# Patient Record
Sex: Female | Born: 1960 | Race: White | Hispanic: No | Marital: Married | Smoking: Never smoker
Health system: Southern US, Community
[De-identification: ages and names within clinical notes are randomized; demographics above are authoritative.]

## PROBLEM LIST (undated history)

## (undated) DIAGNOSIS — I2510 Atherosclerotic heart disease of native coronary artery without angina pectoris: Principal | ICD-10-CM

## (undated) DIAGNOSIS — F4010 Social phobia, unspecified: Secondary | ICD-10-CM

## (undated) DIAGNOSIS — K5790 Diverticulosis of intestine, part unspecified, without perforation or abscess without bleeding: Secondary | ICD-10-CM

## (undated) DIAGNOSIS — F988 Other specified behavioral and emotional disorders with onset usually occurring in childhood and adolescence: Secondary | ICD-10-CM

## (undated) DIAGNOSIS — I471 Supraventricular tachycardia, unspecified: Secondary | ICD-10-CM

## (undated) DIAGNOSIS — G4730 Sleep apnea, unspecified: Secondary | ICD-10-CM

## (undated) DIAGNOSIS — M5136 Other intervertebral disc degeneration, lumbar region: Secondary | ICD-10-CM

## (undated) DIAGNOSIS — T884XXA Failed or difficult intubation, initial encounter: Secondary | ICD-10-CM

## (undated) DIAGNOSIS — J189 Pneumonia, unspecified organism: Secondary | ICD-10-CM

## (undated) DIAGNOSIS — M51369 Other intervertebral disc degeneration, lumbar region without mention of lumbar back pain or lower extremity pain: Secondary | ICD-10-CM

## (undated) DIAGNOSIS — K219 Gastro-esophageal reflux disease without esophagitis: Secondary | ICD-10-CM

## (undated) DIAGNOSIS — I499 Cardiac arrhythmia, unspecified: Secondary | ICD-10-CM

## (undated) DIAGNOSIS — E785 Hyperlipidemia, unspecified: Secondary | ICD-10-CM

## (undated) DIAGNOSIS — K648 Other hemorrhoids: Secondary | ICD-10-CM

## (undated) DIAGNOSIS — I4719 Other supraventricular tachycardia: Secondary | ICD-10-CM

## (undated) HISTORY — DX: Other intervertebral disc degeneration, lumbar region without mention of lumbar back pain or lower extremity pain: M51.369

## (undated) HISTORY — DX: Supraventricular tachycardia: I47.1

## (undated) HISTORY — DX: Diverticulosis of intestine, part unspecified, without perforation or abscess without bleeding: K57.90

## (undated) HISTORY — DX: Other intervertebral disc degeneration, lumbar region: M51.36

## (undated) HISTORY — PX: ABDOMINAL HYSTERECTOMY: SHX81

## (undated) HISTORY — DX: Other hemorrhoids: K64.8

## (undated) HISTORY — PX: TUBAL LIGATION: SHX77

## (undated) HISTORY — DX: Social phobia, unspecified: F40.10

## (undated) HISTORY — DX: Other supraventricular tachycardia: I47.19

## (undated) HISTORY — DX: Atherosclerotic heart disease of native coronary artery without angina pectoris: I25.10

## (undated) HISTORY — PX: OTHER SURGICAL HISTORY: SHX169

## (undated) HISTORY — DX: Supraventricular tachycardia, unspecified: I47.10

## (undated) HISTORY — DX: Hyperlipidemia, unspecified: E78.5

## (undated) HISTORY — PX: SHOULDER SURGERY: SHX246

## (undated) HISTORY — DX: Other specified behavioral and emotional disorders with onset usually occurring in childhood and adolescence: F98.8

## (undated) HISTORY — DX: Failed or difficult intubation, initial encounter: T88.4XXA

---

## 1998-02-26 ENCOUNTER — Other Ambulatory Visit: Admission: RE | Admit: 1998-02-26 | Discharge: 1998-02-26 | Payer: Self-pay | Admitting: Obstetrics and Gynecology

## 1998-07-31 HISTORY — PX: PARTIAL HYSTERECTOMY: SHX80

## 1998-08-12 ENCOUNTER — Inpatient Hospital Stay (HOSPITAL_COMMUNITY): Admission: RE | Admit: 1998-08-12 | Discharge: 1998-08-13 | Payer: Self-pay | Admitting: Obstetrics and Gynecology

## 1999-03-23 ENCOUNTER — Other Ambulatory Visit: Admission: RE | Admit: 1999-03-23 | Discharge: 1999-03-23 | Payer: Self-pay | Admitting: Obstetrics and Gynecology

## 2000-04-11 ENCOUNTER — Other Ambulatory Visit: Admission: RE | Admit: 2000-04-11 | Discharge: 2000-04-11 | Payer: Self-pay | Admitting: Obstetrics and Gynecology

## 2000-07-31 HISTORY — PX: HEMORRHOID SURGERY: SHX153

## 2001-05-22 ENCOUNTER — Other Ambulatory Visit: Admission: RE | Admit: 2001-05-22 | Discharge: 2001-05-22 | Payer: Self-pay | Admitting: Obstetrics and Gynecology

## 2001-06-26 ENCOUNTER — Inpatient Hospital Stay (HOSPITAL_COMMUNITY): Admission: AD | Admit: 2001-06-26 | Discharge: 2001-06-27 | Payer: Self-pay | Admitting: Surgery

## 2002-08-07 ENCOUNTER — Other Ambulatory Visit: Admission: RE | Admit: 2002-08-07 | Discharge: 2002-08-07 | Payer: Self-pay | Admitting: Obstetrics and Gynecology

## 2003-08-01 HISTORY — PX: ABLATION OF DYSRHYTHMIC FOCUS: SHX254

## 2003-08-20 ENCOUNTER — Other Ambulatory Visit: Admission: RE | Admit: 2003-08-20 | Discharge: 2003-08-20 | Payer: Self-pay | Admitting: Obstetrics and Gynecology

## 2003-10-14 ENCOUNTER — Ambulatory Visit (HOSPITAL_COMMUNITY): Admission: RE | Admit: 2003-10-14 | Discharge: 2003-10-15 | Payer: Self-pay | Admitting: Internal Medicine

## 2004-08-17 ENCOUNTER — Ambulatory Visit: Payer: Self-pay

## 2004-08-24 ENCOUNTER — Ambulatory Visit: Payer: Self-pay | Admitting: Internal Medicine

## 2004-10-06 ENCOUNTER — Ambulatory Visit: Payer: Self-pay | Admitting: Internal Medicine

## 2004-10-27 ENCOUNTER — Other Ambulatory Visit: Admission: RE | Admit: 2004-10-27 | Discharge: 2004-10-27 | Payer: Self-pay | Admitting: Obstetrics and Gynecology

## 2005-09-14 ENCOUNTER — Encounter: Admission: RE | Admit: 2005-09-14 | Discharge: 2005-09-14 | Payer: Self-pay | Admitting: Orthopedic Surgery

## 2006-02-28 ENCOUNTER — Ambulatory Visit: Payer: Self-pay | Admitting: Internal Medicine

## 2006-11-14 ENCOUNTER — Emergency Department (HOSPITAL_COMMUNITY): Admission: EM | Admit: 2006-11-14 | Discharge: 2006-11-14 | Payer: Self-pay | Admitting: Emergency Medicine

## 2006-11-26 ENCOUNTER — Ambulatory Visit: Payer: Self-pay

## 2008-01-10 ENCOUNTER — Ambulatory Visit: Payer: Self-pay | Admitting: Internal Medicine

## 2009-03-18 ENCOUNTER — Encounter: Payer: Self-pay | Admitting: Internal Medicine

## 2010-12-13 NOTE — Assessment & Plan Note (Signed)
[facility]                         ELECTROPHYSIOLOGY OFFICE NOTE   JOURNIEE, FELDKAMP                    MRN:          [id number]  DATE:01/10/2008                            DOB:          1961-07-06    HISTORY:  Ms. Dorfman is seen following catheter ablation of a low  septal atrial tachycardia.  Her heart rate has been in his 70s and 80s  pretty much every time she has checked it.  She takes nadolol at 10 mg a  day and tolerates this well.  This was chosen because the tachycardia  was not entirely eliminated on an electrocardiogram 2 years ago.  She  still had an inferior directed P-wave in lead II.  She had an episode of  chest pain about a year ago.  She underwent Myoview scanning which was  normal.  Ejection fraction was 69%.   MEDICATIONS:  1. Her other medications include Simcor started for low HDL in the      context of a very strong positive family history.  2. She also takes aspirin.   PHYSICAL EXAMINATION:  VITAL SIGNS:  Blood pressure 103/57 with a pulse  of 88.  LUNGS:  Clear.  HEART:  Sounds were regular.  ABDOMEN:  Soft.  EXTREMITIES:  Without edema.   DIAGNOSTICS:  Electrocardiogram demonstrated sinus rhythm at 79 with  troponins of 3.08/0.40.   IMPRESSION:  1. Atrial tachycardia status post ablation with some residual      arrhythmia clinically quiescent.  2. Strong family history for coronary disease.  3. Dyslipidemia with low HDL on Simcor.   Ms. Wigle is stable.  We will plan to see her again in 2 years' time.     Duke Salvia, MD, [facility]  Electronically Signed    SCK/MedQ  DD: 01/10/2008  DT: 01/10/2008  Job #: [id number]   cc:   Loraine Leriche A. Perini, M.D.

## 2010-12-16 NOTE — Op Note (Signed)
[place]  Patient:    Cathy Bruce, Cathy Bruce [facility] Visit Number: [id number] MRN: [id number]          Service Type: MED Location: [id number] Attending Physician:  Andre Lefort Dictated by:   Sandria Bales. Ezzard Standing, M.D. Proc. Date: 06/26/01 Admit Date:  06/26/2001   CC:         [place]. Daphine Deutscher, M.D.   Operative Report  PREOPERATIVE DIAGNOSIS:  Impaction of soft stool in rectum.  POSTOPERATIVE DIAGNOSIS:  Rectal fissure and impaction of soft stool in rectum.  OPERATION PERFORMED:  Examination under anesthesia, disimpaction, rectal dilatation.  SURGEON:  Sandria Bales. Ezzard Standing, M.D.  ASSISTANT:  None.  ANESTHESIA:  General.  ESTIMATED BLOOD LOSS:  Minimal.  INDICATIONS FOR PROCEDURE:  Ms. Savarino is a 50 year old white female who had an external hemorrhoid excised by Dr. Daphine Deutscher last Friday.  Has had trouble with diarrhea and anal pain.  Has not ____________ was cathed and got urine out.  Had been pain medicines and had become impacted.  The patient presents to the operating room for disimpaction and examination under anesthesia.  DESCRIPTION OF PROCEDURE:  Patient placed in lithotomy position after general endotracheal anesthetic.  Her rectum was examined.  She had a soft ball of stool probably 8 or 9 cm in length about 8 cm in diameter up in her rectum.  I was able to break this up and clean out her rectum pretty well.  She had an anal fissure along her left posterior rectum and some redundant tissue directly posterior in her rectum.  I dilated her up to three fingers.  She then was cleaned up and we will plan to discharged home tonight to try to send her home on some Vioxx to see if that will control her pain and try to get her away from narcotics, encouraged her to drink six or eight glasses of water a day, use sitz baths. Dictated by:   Sandria Bales. Ezzard Standing, M.D. Attending Physician:  Andre Lefort DD:  06/26/01 TD:   06/27/01 Job: [id number]

## 2010-12-16 NOTE — Assessment & Plan Note (Signed)
[facility]                           ELECTROPHYSIOLOGY OFFICE NOTE   HADJA, HARRAL                    MRN:          [id number]  DATE:02/28/2006                            DOB:          March 11, 1961    Cathy Bruce is doing well.  She is status post EP study for atrial  arrhythmias.  She was found to have a septal tachycardia which we are able  to modify but not eliminate.   Since I have seen her last, her heart rates have been in the 80s.  She has  subsequently discontinued her Diltiazem and has taken that all at 10 mg a  day.  She feels quite normal.   PHYSICAL EXAMINATION:  VITAL SIGNS:  Blood pressure 124/70, pulse 82.  LUNGS:  Clear.  HEART SOUNDS:  Regular.   Electrocardiogram demonstrated an ectopic atrial rhythm with a low atrial  focus with inverted P waves in the inferolateral leads.   IMPRESSION:  1.  Low atrial tachycardia.  2.  No symptoms associated with #1.  3.  Normal left ventricular function 18 months ago.  4.  Progressive ability to decrease medications associated with #1.   We will plan to see Ms. Borland again in about one year's time.  We will  get an echo at that time to make sure there is no LV dysfunction, although I  would anticipate in the absence of tachycardia, she should be fine.  She  will continue her nadolol at 10 mg a day.                                   Duke Salvia, MD, [facility]   SCK/MedQ  DD:  02/28/2006  DT:  02/28/2006  Job #:  [id number]   cc:   Loraine Leriche A. Perini, MD

## 2010-12-16 NOTE — Discharge Summary (Signed)
NAME:  Cathy Bruce, Cathy Bruce           ACCOUNT NO.:  [id number]   MEDICAL RECORD NO.:  [id number]                   PATIENT TYPE:  OIB   LOCATION:  6525                                 FACILITY:  [facility]   PHYSICIAN:  Duke Salvia, M.D.               DATE OF BIRTH:  [birth year]   DATE OF ADMISSION:  10/14/2003  DATE OF DISCHARGE:                                 DISCHARGE SUMMARY   PRIMARY DIAGNOSIS:  Supraventricular tachycardia.   SECONDARY DIAGNOSIS:  Chronic constipation.   HISTORY OF PRESENT ILLNESS:  This is a 50 year old female who was originally  diagnosed with supraventricular tachycardia in November 2004 which was found  on routine exam.  The patient was seen by Dr. Sherryl Manges in the office for  evaluation.  Upon evaluation of her EKG, she was found to have a long RP  tachycardia.  She denied palpitations, fatigue, syncope, presyncope prior to  a diagnosis.  After a diagnosis and being on beta-blockers, the patient has  had complaints of fatigue and palpitations at times.  She was intolerant to  atenolol and has been taking metoprolol.  She had a 2-D echocardiogram which  showed normal LV function.  She was given adenosine in the office for a  rapid heart rate and started on beta-blockers in November.  Since that time  she has some feelings of extreme fatigue and decided to proceed with  tachycardia ablation.  The patient underwent an EP study which showed  multiple tachycardias right-sided and left-sided.  The patient underwent a  right-sided ablation, procedure note not on chart at present time.  The  patient's heart rates were still in the one-teens after procedure, today she  is now in the 100th.  She wished to be discharged on metoprolol 25 mg b.i.d.  and consider Toprol as next choice and calcium blocker, then 1C then a  repeat ablation.  The patient was discharged to home on the following  medications:  metoprolol 25 mg b.i.d., Prilosec 20 mg daily,  multivitamin  daily, Crestor 10 mg every other day.  Other medications as before:  Coated  aspirin 325 mg a day for 6 weeks and antibiotic prophylaxis for the next 6  weeks, Tylenol one to two tabs every 4-6 hours as needed.  No heavy lifting  or strenuous activity for 4 days.  No driving for 2 days.  Low fat, low  salt, low cholesterol diet.  She was to call if she developed a lump or any  drainage in her groins.  A follow up appointment was scheduled with Dr.  Sherryl Manges on April 8th at 11:45 a.m.      Chinita Pester, C.R.N.P. [facility]                 Duke Salvia, M.D.    DS/MEDQ  D:  10/15/2003  T:  10/17/2003  Job:  [id number]   cc:   Loraine Leriche A. Waynard Edwards, M.D.  [address]  [place]  [place]  [address]  Fax: [phone]

## 2010-12-16 NOTE — Op Note (Signed)
NAME:  Cathy Bruce, Cathy Bruce           ACCOUNT NO.:  [id number]   MEDICAL RECORD NO.:  [id number]                   PATIENT TYPE:  OIB   LOCATION:  6525                                 FACILITY:  [facility]   PHYSICIAN:  Duke Salvia, M.D.               DATE OF BIRTH:  [birth year]   DATE OF PROCEDURE:  10/14/2003  DATE OF DISCHARGE:  10/15/2003                                 OPERATIVE REPORT   PREOPERATIVE DIAGNOSIS:  Supraventricular tachycardia - long RP.   POSTOPERATIVE DIAGNOSES:  1. Atrial tachycardia.  2. 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  3. Left sided atrial flutter.   PROCEDURES:  1. Electrophysiologic study.  2. Arrhythmia mapping with electro-anatomical ray.  3. Isoproterenol infusion.  4. Radiofrequency catheter ablation and reassessment.   PERFORMED BY:  Duke Salvia, M.D.   DESCRIPTION OF PROCEDURE:  Following obtaining informed consent the patient  was brought to the electrophysiologic laboratory and placed on the  fluoroscopic table in the supine position. After routine prep and drape  cardiac catheterization was performed with local anesthesia and conscious  sedation.  Non-invasive blood pressure monitoring, transcutaneous oxygen  saturation monitoring and tidal C02 monitoring were performed continuously  throughout the procedure.  Following the procedure the catheter was removed,  hemostasis was obtained and the patient was transferred to the floor in  stable condition.   CATHETERS:  A 5 French quadripolar catheter was inserted via the left  femoral vein to the AV function to measure the His electrogram.   The electro-anatomical ray from [facility] was inserted via the left femoral vein  to mapping sites in the low right atrium.   A 7 French, 4 mm deflectable tip ablation catheter was inserted via the  right femoral vein to mapping sites in the posterior septal space as well as  advanced to the coronary sinus and to the  right ventricle for pacing.   Surface leads 1, AVF and V1 were monitored continuously throughout the  procedure.  Following insertion of the catheter to stimulation protocol  included:  1. Incremental atrial pacing.  2. Incremental ventricular pacing.  3. Burst atrial pacing.  4. Single and double atrial extrastimuli at a pacing cycle length of 600,     500 and 400 milliseconds in the presence and in the absence of     Isoproterenol.  5. Single ventricular extrastimuli.   RESULTS:  SURFACE ELECTROCARDIOGRAM:   Initial and Final:  1. Rhythm initial is ectopic atrial and the final is ectopic atrial  2. Catheter manipulation resulted in the termination of the ectopic atrial     rhythm and measurements were then made in sinus rhythm initially with an     R interval of 646 milliseconds, a PR interval of 104 milliseconds, a QRS     duration of 83 milliseconds and a QT interval of 353 milliseconds, a T     wave duration of 83 milliseconds.  Bundle branch block was absent and  preexcitation was absent.  3. The AH interval was 57 milliseconds.  The HV interval was 41     milliseconds.  The initial duration was 14 milliseconds.   The final rhythm was again ectopic atrial with a cycle length of 425  milliseconds, a PR interval of 94 milliseconds, a QRS duration of 83  milliseconds, QT interval was 306 milliseconds.  Bundle branch block was  absent. Preexcitation was absent.   The AH interval was 47 milliseconds and the HV interval was 47 milliseconds.   AV NODAL FUNCTION:  The AV Wenckebach cycle length was 280 milliseconds.  AV  conduction was continuous.  Retrograde conduction Wenckebach but I do not  have the cycle length at which it did this.   Accessory pathway function:  No evidence of an accessory pathway was  identified.   Arrhythmias induced:  1. Atrial flutter/ablation was induced with an atrial cycle length of 190     milliseconds.  Intracardial ray mapping demonstrated  that the right     atrium was being activated passively in a lateral cephalad to caudal     direction.  2. A septal tachycardia was identified which was the primary arrhythmia.  RF     ablation successfully eliminated septal to right atrial conduction, but     allowed for signal penetration from the septum into the left atrium and     subsequent activation of the heart with very little change in the surface     P wave activity.  3. A QRS tachycardia with a cycle length of about 443 milliseconds was     identified which by electro-anatomical mapping originated in the same     region as the sinus node.  This was slowed gradually even though it was     inducible by programmed stimulation.   RF energy for a total of 2 minutes and 29 seconds of radio wave energy was  applied to the exit site of the primary ectopic tachycardia that was  identified as an outpatient which was an incessant long RP tachycardia.  As  noted previously, RF energy successfully eliminated septal to right atrial  activation but allowed for left atrial activation.   IMPRESSION:  1. Normal sinus function.  2. Abnormal atrial function manifested by the aforementioned three atrial     tachycardias/flutters.  3. Normal AV nodal function.  4. Normal His Purkinje system function.  5. No accessory pathway.  6. Normal ventricular response to programmed stimulation.   SUMMARY AND CONCLUSIONS:  The results of the electrophysiologic testing  demonstrated a septal atrial tachycardia as underlying the patient's  incessant rhythm.  This helps to explain the very short PR interval that was  seen in tachycardia; that is, it was shorter than sinus rhythm suggesting  that it was using the rapid left atrial AV nodal inputs.  In addition  because of the other arrhythmias, it was elected to treat her medically at  this point although consideration of repeat ablation may be necessary in the event particularly that we are not able to  control the incessant atrial  tachycardia.   To that end, we will resume again low dose beta blockade and see if we can  find a combination which is successful in slowing her rhythm but not  associated with side effects.  Duke Salvia, M.D.    SCK/MEDQ  D:  10/19/2003  T:  10/19/2003  Job:  [id number]   cc:   Loraine Leriche A. Waynard Edwards, M.D.  [address]  [place]  [place] [address]  Fax: [phone]

## 2011-01-04 ENCOUNTER — Encounter: Payer: Self-pay | Admitting: Internal Medicine

## 2011-01-26 ENCOUNTER — Encounter: Payer: Self-pay | Admitting: Internal Medicine

## 2011-01-30 ENCOUNTER — Encounter: Payer: Self-pay | Admitting: Internal Medicine

## 2011-02-07 ENCOUNTER — Encounter: Payer: Self-pay | Admitting: *Deleted

## 2011-02-07 ENCOUNTER — Encounter: Payer: Self-pay | Admitting: Internal Medicine

## 2011-02-08 ENCOUNTER — Encounter: Payer: Self-pay | Admitting: Internal Medicine

## 2011-02-08 ENCOUNTER — Ambulatory Visit (INDEPENDENT_AMBULATORY_CARE_PROVIDER_SITE_OTHER): Payer: Managed Care, Other (non HMO) | Admitting: Internal Medicine

## 2011-02-08 VITALS — BP 113/70 | HR 93 | Ht 64.5 in | Wt 152.8 lb

## 2011-02-08 DIAGNOSIS — I498 Other specified cardiac arrhythmias: Secondary | ICD-10-CM

## 2011-02-08 DIAGNOSIS — I471 Supraventricular tachycardia: Secondary | ICD-10-CM

## 2011-02-08 NOTE — Progress Notes (Signed)
HPI: Cathy Bruce is a 51 y.o. female Cathy Bruce is seen following catheter ablation of a low  septal atrial tachycardia. Her heart rate has been in his 70s and 80s  pretty much every time she has checked it. She takes nadolol at 10 mg a  day and tolerates this well.     Current Outpatient Prescriptions  Medication Sig Dispense Refill  . aspirin 81 MG tablet Take 81 mg by mouth daily.        . citalopram (CELEXA) 20 MG tablet 1 tablet Daily.      . furosemide (LASIX) 20 MG tablet 1 tablet as needed.      Marland Kitchen LOVAZA 1 G capsule 1 capsule Daily.      . Multiple Vitamin (MULTIVITAMIN) tablet Take 1 tablet by mouth daily.        . nadolol (CORGARD) 20 MG tablet Take 10 mg by mouth daily.       Marland Kitchen omeprazole (PRILOSEC) 20 MG capsule 1 capsule Daily.      Marland Kitchen SIMCOR 750-20 MG 24 hr tablet 1 tablet Daily.        No Known Allergies  Past Medical History  Diagnosis Date  . Atrial tachycardia     hx of  . Dyslipidemia     No past surgical history on file.  Family History  Problem Relation Age of Onset  . Coronary artery disease      History   Social History  . Marital Status: Married    Spouse Name: N/A    Number of Children: N/A  . Years of Education: N/A   Occupational History  . Not on file.   Social History Main Topics  . Smoking status: Never Smoker   . Smokeless tobacco: Never Used  . Alcohol Use: Yes     occasional  . Drug Use: No  . Sexually Active: Not on file   Other Topics Concern  . Not on file   Social History Narrative  . No narrative on file    Fourteen point review of systems was negative except as noted in HPI and PMH   PHYSICAL EXAMINATION  Blood pressure 113/70, pulse 93, height 5' 4.5" (1.638 m), weight 152 lb 12.8 oz (69.31 kg).   Well developed and nourished in no acute distress HENT normal Neck supple with JVP-flat Carotids brisk and full without bruits Back without scoliosis or kyphosis Clear Regular rate and rhythm, no  murmurs or gallops Abd-soft with active BS without hepatomegaly or midline pulsation Femoral pulses 2+ distal pulses intact No Clubbing cyanosis edema Skin-warm and dry LN-neg submandibular and supraclavicular A & Oriented CN 3-12 normal  Grossly normal sensory and motor function Affect engaging .

## 2011-02-08 NOTE — Patient Instructions (Signed)

## 2011-02-09 ENCOUNTER — Ambulatory Visit (HOSPITAL_COMMUNITY): Payer: Managed Care, Other (non HMO) | Attending: Internal Medicine | Admitting: Radiology

## 2011-02-09 DIAGNOSIS — I079 Rheumatic tricuspid valve disease, unspecified: Secondary | ICD-10-CM | POA: Insufficient documentation

## 2011-02-09 DIAGNOSIS — I059 Rheumatic mitral valve disease, unspecified: Secondary | ICD-10-CM | POA: Insufficient documentation

## 2011-02-09 DIAGNOSIS — I471 Supraventricular tachycardia, unspecified: Secondary | ICD-10-CM | POA: Insufficient documentation

## 2011-02-10 ENCOUNTER — Telehealth: Payer: Self-pay | Admitting: Internal Medicine

## 2011-02-10 NOTE — Telephone Encounter (Signed)
Results ECHO given to pt--nt

## 2011-02-10 NOTE — Telephone Encounter (Signed)
Pt calling for results of US.

## 2011-02-13 ENCOUNTER — Encounter: Payer: Self-pay | Admitting: Internal Medicine

## 2011-02-13 DIAGNOSIS — I471 Supraventricular tachycardia: Secondary | ICD-10-CM | POA: Insufficient documentation

## 2011-02-13 DIAGNOSIS — I4719 Other supraventricular tachycardia: Secondary | ICD-10-CM | POA: Insufficient documentation

## 2011-02-13 NOTE — Assessment & Plan Note (Signed)
She contiues with a low focus bssed on ECG   In the absecne of symptoms or objective dato regrding LV dysfuntion would not do anything diifferently thus we will get an echo

## 2011-02-23 NOTE — Telephone Encounter (Signed)
Test results

## 2011-02-23 NOTE — Telephone Encounter (Signed)
Will route to correct person

## 2011-02-23 NOTE — Telephone Encounter (Signed)
Returning call back. 

## 2011-02-27 ENCOUNTER — Telehealth: Payer: Self-pay | Admitting: Internal Medicine

## 2011-02-27 NOTE — Telephone Encounter (Signed)
I spoke with the patient. 

## 2011-02-27 NOTE — Telephone Encounter (Signed)
Per pt call, pt wanting to speak with nurse about getting results of ultrasound. Please return pt call to discuss.

## 2011-05-17 ENCOUNTER — Other Ambulatory Visit: Payer: Self-pay | Admitting: Internal Medicine

## 2011-07-12 ENCOUNTER — Encounter: Payer: Self-pay | Admitting: Internal Medicine

## 2011-07-12 ENCOUNTER — Ambulatory Visit (AMBULATORY_SURGERY_CENTER): Payer: Managed Care, Other (non HMO) | Admitting: *Deleted

## 2011-07-12 VITALS — Ht 64.0 in | Wt 154.7 lb

## 2011-07-12 DIAGNOSIS — Z1211 Encounter for screening for malignant neoplasm of colon: Secondary | ICD-10-CM

## 2011-07-12 MED ORDER — MOVIPREP 100 G PO SOLR: ORAL | Status: DC

## 2011-07-18 ENCOUNTER — Encounter: Payer: Self-pay | Admitting: Internal Medicine

## 2011-07-18 ENCOUNTER — Ambulatory Visit (AMBULATORY_SURGERY_CENTER): Payer: Managed Care, Other (non HMO) | Admitting: Internal Medicine

## 2011-07-18 VITALS — BP 113/64 | HR 64 | Temp 97.3°F | Resp 18 | Ht 64.0 in | Wt 154.0 lb

## 2011-07-18 DIAGNOSIS — Z1211 Encounter for screening for malignant neoplasm of colon: Secondary | ICD-10-CM

## 2011-07-18 MED ORDER — SODIUM CHLORIDE 0.9 % IV SOLN: 500.0000 mL | INTRAVENOUS | Status: DC

## 2011-07-18 NOTE — Progress Notes (Signed)
Patient did not experience any of the following events: a burn prior to discharge; a fall within the facility; wrong site/side/patient/procedure/implant event; or a hospital transfer or hospital admission upon discharge from the facility. (G8907) Patient did not have preoperative order for IV antibiotic SSI prophylaxis. (G8918)  

## 2011-07-18 NOTE — Op Note (Signed)
[facility] [address] [place], [place]  [address]  COLONOSCOPY PROCEDURE REPORT  PATIENT:  Cathy Bruce, Cathy Bruce  MR#:  [id number] BIRTHDATE:  01-Feb-1961, 50 yrs. old  GENDER:  female ENDOSCOPIST:  Carie Caddy. Makya Yurko, MD REF. BY:  Rodrigo Ran, M.D. PROCEDURE DATE:  07/18/2011 PROCEDURE:  Colonoscopy 19147 ASA CLASS:  Class II INDICATIONS:  Routine Risk Screening (1st colonoscopy) MEDICATIONS:   These medications were titrated to patient response per physician's verbal order, Versed 8 mg IV, Fentanyl 75 mcg IV  DESCRIPTION OF PROCEDURE:   After the risks benefits and alternatives of the procedure were thoroughly explained, informed consent was obtained.  Digital rectal exam was performed and revealed perianal skin tags.   The LB [id number]-[id number] endoscope was introduced through the anus and advanced to the terminal ileum which was intubated for a short distance, without limitations. The quality of the prep was good, using MoviPrep.  The instrument was then slowly withdrawn as the colon was fully examined.<<PROCEDUREIMAGES>>  FINDINGS:  Mild diverticulosis was found at the hepatic flexure and in the descending and sigmoid colon.  Internal Hemorrhoids were found.   Retroflexed views in the rectum revealed hypertrophied anal papillae and no other findings other than those already described.  The scope was then withdrawn from the cecum and the procedure completed.  COMPLICATIONS:  None ENDOSCOPIC IMPRESSION: 1) Mild diverticulosis at the hepatic flexure, descending and sigmoid colon 2) Internal hemorrhoids 3) Hypertrophied anal papillae  RECOMMENDATIONS: 1) High fiber diet. 2) You should continue to follow colorectal cancer screening guidelines for "routine risk" patients with a repeat colonoscopy in 10 years. There is no need for FOBT (stool) testing for at least 5 years.  Carie Caddy. Rhea Belton, MD  CC:  Rodrigo Ran, MD The Patient  n. eSIGNEDCarie Caddy. Evania Lyne at  07/18/2011 08:56 AM  Barbara Cower, [phone]

## 2011-07-19 ENCOUNTER — Telehealth: Payer: Self-pay

## 2011-07-19 NOTE — Telephone Encounter (Signed)
I left a message on the pt's answering machine to call if any questions or concerns. maw

## 2011-11-06 ENCOUNTER — Other Ambulatory Visit: Payer: Self-pay | Admitting: Internal Medicine

## 2011-11-06 NOTE — Telephone Encounter (Signed)
Refilled nadolol

## 2012-03-03 ENCOUNTER — Other Ambulatory Visit: Payer: Self-pay | Admitting: Internal Medicine

## 2012-03-04 NOTE — Telephone Encounter (Signed)
Pt needs appointment then refill can be made Fax Received. Refill Completed. Cathy Bruce (R.M.A)   

## 2012-05-19 ENCOUNTER — Other Ambulatory Visit: Payer: Self-pay | Admitting: Internal Medicine

## 2012-07-19 ENCOUNTER — Telehealth: Payer: Self-pay | Admitting: Internal Medicine

## 2012-07-19 MED ORDER — NADOLOL 20 MG PO TABS: 20.0000 mg | ORAL_TABLET | Freq: Every day | ORAL | Status: DC

## 2012-07-19 NOTE — Telephone Encounter (Signed)
New Problem:    Patient called in needing a refill of her nadolol (CORGARD) 20 MG tablet.

## 2012-08-16 ENCOUNTER — Other Ambulatory Visit: Payer: Self-pay

## 2012-08-16 NOTE — Telephone Encounter (Signed)
[name], you took care of this didn't you? If so you don't have to respond back to me.

## 2012-09-20 ENCOUNTER — Ambulatory Visit: Payer: Managed Care, Other (non HMO) | Admitting: Internal Medicine

## 2012-10-02 ENCOUNTER — Ambulatory Visit: Payer: Managed Care, Other (non HMO) | Admitting: Internal Medicine

## 2013-07-17 ENCOUNTER — Encounter: Payer: Self-pay | Admitting: Internal Medicine

## 2013-07-17 ENCOUNTER — Ambulatory Visit (INDEPENDENT_AMBULATORY_CARE_PROVIDER_SITE_OTHER): Payer: Managed Care, Other (non HMO) | Admitting: Internal Medicine

## 2013-07-17 VITALS — BP 102/60 | HR 91 | Ht 64.0 in | Wt 145.0 lb

## 2013-07-17 DIAGNOSIS — R0609 Other forms of dyspnea: Secondary | ICD-10-CM

## 2013-07-17 DIAGNOSIS — I498 Other specified cardiac arrhythmias: Secondary | ICD-10-CM

## 2013-07-17 DIAGNOSIS — I471 Supraventricular tachycardia: Secondary | ICD-10-CM

## 2013-07-17 NOTE — Patient Instructions (Signed)
Your physician recommends that you continue on your current medications as directed. Please refer to the Current Medication list given to you today.  Your physician has requested that you have an echocardiogram in January. Echocardiography is a painless test that uses sound waves to create images of your heart. It provides your doctor with information about the size and shape of your heart and how well your heart's chambers and valves are working. This procedure takes approximately one hour. There are no restrictions for this procedure.  Your physician recommends that you schedule a follow-up appointment with Dr. Johney Frame for new eval of atrial tachycardia.

## 2013-07-17 NOTE — Assessment & Plan Note (Signed)
As above.

## 2013-07-17 NOTE — Assessment & Plan Note (Signed)
She has a low atrial focus. There has been some recent evidence of exercise intolerance notable and stairclimbing. We'll undertake an echo to reassess LV function and refer her to Dr. Fawn Kirk for consideration of repeat ablation

## 2013-07-17 NOTE — Progress Notes (Signed)
      Patient Care Team: Ezequiel Kayser, MD as PCP - General (Internal Medicine)   HPI  Cathy Bruce is a 52 y.o. female Seen in followup for ablated of low septal tachycardia with residual activity but without symptoms so has been followed along  She continues to have heart rates in the 80-90 and recently noted DOE on stairs   Past Medical History  Diagnosis Date  . Atrial tachycardia     hx of  . Dyslipidemia     Past Surgical History  Procedure Laterality Date  . Partial hysterectomy  2000  . C-section      x2  . Ablation of dysrhythmic focus  2005    Current Outpatient Prescriptions  Medication Sig Dispense Refill  . aspirin 81 MG tablet Take 81 mg by mouth daily.        . citalopram (CELEXA) 20 MG tablet 1 tablet Daily.      . furosemide (LASIX) 20 MG tablet 1 tablet as needed.      Marland Kitchen LOVAZA 1 G capsule 4 (four) times daily.       . meloxicam (MOBIC) 7.5 MG tablet Take 7.5 mg by mouth daily.       . Multiple Vitamin (MULTIVITAMIN) tablet Take 1 tablet by mouth daily.        . nadolol (CORGARD) 20 MG tablet Take 1 tablet (20 mg total) by mouth daily.  15 tablet  0  . NEXIUM 40 MG capsule Take 40 mg by mouth daily after supper.      Marland Kitchen SIMCOR 750-20 MG 24 hr tablet Take 1 tablet by mouth 2 (two) times daily.        No current facility-administered medications for this visit.    No Known Allergies  Review of Systems negative except from HPI and PMH  Physical Exam BP 102/60  Pulse 91  Ht 5\' 4"  (1.626 m)  Wt 145 lb (65.772 kg)  BMI 24.88 kg/m2 Well developed and well nourished in no acute distress HENT normal E scleral and icterus clear Neck Supple JVP flat; carotids brisk and full Clear to ausculation  Regular rate and rhythm, no murmurs gallops or rub Soft with active bowel sounds No clubbing cyanosis none Edema Alert and oriented, grossly normal motor and sensory function Skin Warm and Dry  ECG  :Low atrial focus aand rate at 91  Assessment  and  Plan

## 2013-07-31 HISTORY — PX: TRANSTHORACIC ECHOCARDIOGRAM: SHX275

## 2013-08-06 ENCOUNTER — Ambulatory Visit (INDEPENDENT_AMBULATORY_CARE_PROVIDER_SITE_OTHER): Payer: Managed Care, Other (non HMO) | Admitting: Internal Medicine

## 2013-08-06 ENCOUNTER — Encounter: Payer: Self-pay | Admitting: Internal Medicine

## 2013-08-06 VITALS — BP 109/71 | HR 75 | Ht 64.5 in | Wt 145.2 lb

## 2013-08-06 DIAGNOSIS — I498 Other specified cardiac arrhythmias: Secondary | ICD-10-CM

## 2013-08-06 DIAGNOSIS — I471 Supraventricular tachycardia: Secondary | ICD-10-CM

## 2013-08-06 NOTE — Patient Instructions (Signed)
Your physician recommends that you schedule a follow-up appointment as needed with Dr Rayann Heman   Call if and when you want to proceed with ablation

## 2013-08-06 NOTE — Progress Notes (Signed)
Primary Care Physician: PERINI,MARK A, MD Referring Physician:  Dr Klein   Cathy Bruce is a 52 y.o. female with a h/o ectopic atrial tachycardia who presents today for EP follow-up.  She reports initially developing tachycardia more than 10 years ago.  She was evaluated by Dr Klein and underwent EP study which revealed left atrial tachycardia.  She has been treated with metoprolol initially.  Due to hypotension, she was placed on nadolol.  She finds that she has occasional lightheadedness upon climbing stairs.  She attributes this to nadolol.  Though she does not have tachypalpitations, she feels that her exercise tolerance is decreased in her low atrial rhythm. Today, she denies symptoms of chest pain, shortness of breath, orthopnea, PND, lower extremity edema, dizziness, presyncope, syncope, or neurologic sequela. The patient is tolerating medications without difficulties and is otherwise without complaint today.   Past Medical History  Diagnosis Date  . Atrial tachycardia     s/p ablation by Dr Klein in 2005  . Dyslipidemia    Past Surgical History  Procedure Laterality Date  . Partial hysterectomy  2000  . C-section      x2  . Ablation of dysrhythmic focus  2005    atrial tachycardia ablated by Dr Klein    Current Outpatient Prescriptions  Medication Sig Dispense Refill  . aspirin 81 MG tablet Take 81 mg by mouth daily.        . citalopram (CELEXA) 20 MG tablet Take 1 tablet by mouth Daily.       . furosemide (LASIX) 20 MG tablet Take 1 tablet by mouth as needed.       . LOVAZA 1 G capsule Take 1 g by mouth 4 (four) times daily.       . meloxicam (MOBIC) 7.5 MG tablet Take 7.5 mg by mouth daily.       . Multiple Vitamin (MULTIVITAMIN) tablet Take 1 tablet by mouth daily.        . nadolol (CORGARD) 20 MG tablet Take 1 tablet (20 mg total) by mouth daily.  15 tablet  0  . NEXIUM 40 MG capsule Take 40 mg by mouth daily after supper.      . SIMCOR 750-20 MG 24 hr tablet Take 1  tablet by mouth 2 (two) times daily.        No current facility-administered medications for this visit.    No Known Allergies  History   Social History  . Marital Status: Married    Spouse Name: N/A    Number of Children: N/A  . Years of Education: N/A   Occupational History  . Not on file.   Social History Main Topics  . Smoking status: Never Smoker   . Smokeless tobacco: Never Used  . Alcohol Use: 0.6 oz/week    1 Glasses of wine per week     Comment: occasional  . Drug Use: No  . Sexual Activity: Not on file   Other Topics Concern  . Not on file   Social History Narrative   Lives in [place] with spouse.  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 Coronary artery disease    . Heart disease Father     ROS- All systems are reviewed and negative except as per the HPI above  Physical Exam: Filed Vitals:   08/06/13 1520  BP: 109/71  Pulse: 75  Height: 5' 4.5" (1.638 m)  Weight: 145   lb 3.2 oz (65.862 kg)    GEN- The patient is well appearing, alert and oriented x 3 today.   Head- normocephalic, atraumatic Eyes-  Sclera clear, conjunctiva pink Ears- hearing intact Oropharynx- clear Neck- supple, no JVP Lymph- no cervical lymphadenopathy Lungs- Clear to ausculation bilaterally, normal work of breathing Heart- Regular rate and rhythm, no murmurs, rubs or gallops, PMI not laterally displaced GI- soft, NT, ND, + BS Extremities- no clubbing, cyanosis, or edema MS- no significant deformity or atrophy Skin- no rash or lesion Psych- euthymic mood, full affect Neuro- strength and sensation are intact  EKG today reveals an ectopic atrial rhythm, no ischemic changes GXT performed in the office today reveals that at 90-95 bpm that her sinus node takes over and she is able to achieve 92% of maximal predicted heart rate  (155 bpm) on her home regiment of nadolol.  She exercised to stage 4 for a total exercise  time of 10:02 minutes on a standard bruce protocol without ischemic changes.  During recovery, at 90 bpm, her ectopic atrial rhythm took over and became her resting rhythm  Echo is pending  Assessment and Plan:  1. Atrial tachycardia/ ectopic atrial rhythm The patient has been symptomatic with her ectopic atrial rhythm.  She also reports some symptoms with nadolol.  She is dissatisfied with her present quality of life and would like to reconsider ablation. Therapeutic strategies for supraventricular tachycardia including medicine and ablation were discussed in detail with the patient today. Risk, benefits, and alternatives to EP study and radiofrequency ablation were also discussed in detail today. These risks include but are not limited to stroke, bleeding, vascular damage, tamponade, perforation, damage to the heart and other structures, AV block requiring pacemaker, worsening renal function, and death. The patient understands these risk and wishes to proceed.  We will therefore proceed with catheter ablation at the next available time.  Carto, intracardiac echo and anesthesia are required for this procedure.

## 2013-08-07 ENCOUNTER — Other Ambulatory Visit (HOSPITAL_COMMUNITY): Payer: Managed Care, Other (non HMO)

## 2013-08-07 ENCOUNTER — Ambulatory Visit (HOSPITAL_COMMUNITY): Payer: Managed Care, Other (non HMO) | Attending: Internal Medicine | Admitting: Radiology

## 2013-08-07 ENCOUNTER — Encounter: Payer: Self-pay | Admitting: Cardiology

## 2013-08-07 ENCOUNTER — Telehealth: Payer: Self-pay | Admitting: Internal Medicine

## 2013-08-07 DIAGNOSIS — I079 Rheumatic tricuspid valve disease, unspecified: Secondary | ICD-10-CM | POA: Insufficient documentation

## 2013-08-07 DIAGNOSIS — E785 Hyperlipidemia, unspecified: Secondary | ICD-10-CM | POA: Insufficient documentation

## 2013-08-07 DIAGNOSIS — I471 Supraventricular tachycardia: Secondary | ICD-10-CM

## 2013-08-07 DIAGNOSIS — I498 Other specified cardiac arrhythmias: Secondary | ICD-10-CM | POA: Insufficient documentation

## 2013-08-07 DIAGNOSIS — I080 Rheumatic disorders of both mitral and aortic valves: Secondary | ICD-10-CM | POA: Insufficient documentation

## 2013-08-07 NOTE — Progress Notes (Signed)
Echocardiogram performed.  

## 2013-08-07 NOTE — Telephone Encounter (Signed)
Pt wants to have Ablation on 08/26/2013, please set up and give pt a call

## 2013-08-11 NOTE — Telephone Encounter (Signed)
This is scheduled for 08/26/13  I will finish up Monday and call the patient

## 2013-08-12 ENCOUNTER — Telehealth: Payer: Self-pay | Admitting: Internal Medicine

## 2013-08-12 NOTE — Telephone Encounter (Signed)
New message ° ° ° ° ° °Want echo results °

## 2013-08-13 ENCOUNTER — Encounter (HOSPITAL_COMMUNITY): Payer: Self-pay | Admitting: Pharmacy Technician

## 2013-08-15 NOTE — Telephone Encounter (Signed)
Follow up   Waiting on echo test results.

## 2013-08-15 NOTE — Telephone Encounter (Signed)
Called requesting results of Echo.  Advised that Dr. Caryl Comes has not read results and as soon as they have been read will call her. Also wanted to know where she needed to go for lab work for her Ablation. Advised she comes to our office for labs.

## 2013-08-15 NOTE — Telephone Encounter (Signed)
See note

## 2013-08-15 NOTE — Telephone Encounter (Signed)
New message  Patient is asking where does she goes to have blood work for upcoming procedure

## 2013-08-15 NOTE — Telephone Encounter (Signed)
Lmtcb.  Pt has appointment for blood work on tue, 1/20 at [address] [facility] lab

## 2013-08-19 ENCOUNTER — Telehealth: Payer: Self-pay | Admitting: Internal Medicine

## 2013-08-19 ENCOUNTER — Encounter: Payer: Self-pay | Admitting: *Deleted

## 2013-08-19 ENCOUNTER — Other Ambulatory Visit: Payer: Self-pay | Admitting: *Deleted

## 2013-08-19 ENCOUNTER — Other Ambulatory Visit (INDEPENDENT_AMBULATORY_CARE_PROVIDER_SITE_OTHER): Payer: Managed Care, Other (non HMO)

## 2013-08-19 DIAGNOSIS — I498 Other specified cardiac arrhythmias: Secondary | ICD-10-CM

## 2013-08-19 DIAGNOSIS — I471 Supraventricular tachycardia: Secondary | ICD-10-CM

## 2013-08-19 LAB — BASIC METABOLIC PANEL
BUN: 7 mg/dL (ref 6–23)
CO2: 27 mEq/L (ref 19–32)
Calcium: 8.8 mg/dL (ref 8.4–10.5)
Chloride: 107 mEq/L (ref 96–112)
Creatinine, Ser: 0.7 mg/dL (ref 0.4–1.2)
GFR: 88.58 mL/min (ref >60.00)
GLUCOSE: 121 mg/dL — AB (ref 70–99)
Potassium: 3.2 mEq/L — ABNORMAL LOW (ref 3.5–5.1)
SODIUM: 139 meq/L (ref 135–145)

## 2013-08-19 LAB — CBC WITH DIFFERENTIAL/PLATELET
BASOS PCT: 0.3 % (ref 0.0–3.0)
Basophils Absolute: 0 10*3/uL (ref 0.0–0.1)
EOS ABS: 0.1 10*3/uL (ref 0.0–0.7)
EOS PCT: 1.1 % (ref 0.0–5.0)
HCT: 38.2 % (ref 36.0–46.0)
Hemoglobin: 13.2 g/dL (ref 12.0–15.0)
LYMPHS PCT: 31.8 % (ref 12.0–46.0)
Lymphs Abs: 1.7 10*3/uL (ref 0.7–4.0)
MCHC: 34.4 g/dL (ref 30.0–36.0)
MCV: 90.8 fl (ref 78.0–100.0)
MONO ABS: 0.2 10*3/uL (ref 0.1–1.0)
Monocytes Relative: 4.5 % (ref 3.0–12.0)
NEUTROS ABS: 3.4 10*3/uL (ref 1.4–7.7)
NEUTROS PCT: 62.3 % (ref 43.0–77.0)
Platelets: 161 10*3/uL (ref 150.0–400.0)
RBC: 4.21 Mil/uL (ref 3.87–5.11)
RDW: 12.8 % (ref 11.5–14.6)
WBC: 5.4 10*3/uL (ref 4.5–10.5)

## 2013-08-19 NOTE — Telephone Encounter (Signed)
New message  Patient would like you to call her regarding her test results. Please call and advise.

## 2013-08-20 NOTE — Telephone Encounter (Signed)
A user error has taken place: encounter opened in error, closed for administrative reasons.   See 1/16 note

## 2013-08-20 NOTE — Telephone Encounter (Signed)
Advised pt that I will show echo results to Dr. Caryl Comes and get back with her. She is agreeable to plan.

## 2013-08-22 ENCOUNTER — Telehealth: Payer: Self-pay | Admitting: *Deleted

## 2013-08-22 MED ORDER — POTASSIUM CHLORIDE CRYS ER 20 MEQ PO TBCR: 20.0000 meq | EXTENDED_RELEASE_TABLET | Freq: Every day | ORAL | Status: DC

## 2013-08-22 NOTE — Telephone Encounter (Signed)
Message copied by Dionicio Stall on Fri Aug 22, 2013  5:18 PM ------      Message from: Kiger Grayer      Created: Wed Aug 20, 2013 11:09 PM       Results reviewed.  Please inform pt of result.  Add potassium 20 meq daily ------

## 2013-08-22 NOTE — Telephone Encounter (Signed)
Spoke to patient and let her know to start Potassium 54meq daily  Will call in RX

## 2013-08-25 ENCOUNTER — Other Ambulatory Visit: Payer: Self-pay

## 2013-08-25 MED ORDER — POTASSIUM CHLORIDE CRYS ER 20 MEQ PO TBCR: 20.0000 meq | EXTENDED_RELEASE_TABLET | Freq: Every day | ORAL | Status: DC

## 2013-08-26 ENCOUNTER — Encounter (HOSPITAL_COMMUNITY): Payer: Self-pay | Admitting: Anesthesiology

## 2013-08-26 ENCOUNTER — Ambulatory Visit (HOSPITAL_COMMUNITY): Payer: Managed Care, Other (non HMO) | Admitting: Anesthesiology

## 2013-08-26 ENCOUNTER — Encounter (HOSPITAL_COMMUNITY): Payer: Managed Care, Other (non HMO) | Admitting: Anesthesiology

## 2013-08-26 ENCOUNTER — Encounter (HOSPITAL_COMMUNITY)
Admission: RE | Disposition: A | Discharge status: Home / Self Care | Payer: Self-pay | Source: Ambulatory Visit | Attending: Internal Medicine

## 2013-08-26 ENCOUNTER — Ambulatory Visit (HOSPITAL_COMMUNITY)
Admission: RE | Admit: 2013-08-26 | Discharge: 2013-08-26 | Disposition: A | Discharge status: Home / Self Care | Payer: Managed Care, Other (non HMO) | Source: Ambulatory Visit | Attending: Internal Medicine | Admitting: Internal Medicine

## 2013-08-26 DIAGNOSIS — I4719 Other supraventricular tachycardia: Secondary | ICD-10-CM | POA: Diagnosis present

## 2013-08-26 DIAGNOSIS — I471 Supraventricular tachycardia, unspecified: Secondary | ICD-10-CM | POA: Diagnosis present

## 2013-08-26 DIAGNOSIS — Z7982 Long term (current) use of aspirin: Secondary | ICD-10-CM | POA: Insufficient documentation

## 2013-08-26 DIAGNOSIS — I498 Other specified cardiac arrhythmias: Secondary | ICD-10-CM | POA: Insufficient documentation

## 2013-08-26 DIAGNOSIS — E785 Hyperlipidemia, unspecified: Secondary | ICD-10-CM | POA: Insufficient documentation

## 2013-08-26 HISTORY — PX: ATRIAL FIBRILLATION ABLATION: SHX5456

## 2013-08-26 HISTORY — DX: Gastro-esophageal reflux disease without esophagitis: K21.9

## 2013-08-26 HISTORY — PX: ABLATION OF DYSRHYTHMIC FOCUS: SHX254

## 2013-08-26 LAB — POTASSIUM: POTASSIUM: 3.8 meq/L (ref 3.7–5.3)

## 2013-08-26 SURGERY — ATRIAL FIBRILLATION ABLATION
Anesthesia: Monitor Anesthesia Care

## 2013-08-26 MED ORDER — ACETAMINOPHEN 325 MG PO TABS
650.0000 mg | ORAL_TABLET | ORAL | Status: DC | PRN
  Administered 2013-08-26: 650 mg via ORAL
  Filled 2013-08-26: qty 2

## 2013-08-26 MED ORDER — MIDAZOLAM HCL 5 MG/5ML IJ SOLN
INTRAMUSCULAR | Status: DC | PRN
  Administered 2013-08-26 (×2): 1 mg via INTRAVENOUS

## 2013-08-26 MED ORDER — METOPROLOL TARTRATE 1 MG/ML IV SOLN
INTRAVENOUS | Status: AC
  Filled 2013-08-26: qty 5

## 2013-08-26 MED ORDER — PHENYLEPHRINE HCL 10 MG/ML IJ SOLN
INTRAMUSCULAR | Status: DC | PRN
  Administered 2013-08-26: 50 ug via INTRAVENOUS

## 2013-08-26 MED ORDER — METOPROLOL TARTRATE 1 MG/ML IV SOLN
INTRAVENOUS | Status: DC | PRN
  Administered 2013-08-26 (×3): 5 mg via INTRAVENOUS

## 2013-08-26 MED ORDER — HEPARIN SODIUM (PORCINE) 1000 UNIT/ML IJ SOLN
INTRAMUSCULAR | Status: AC
  Filled 2013-08-26: qty 1

## 2013-08-26 MED ORDER — ISOPROTERENOL HCL 0.2 MG/ML IJ SOLN
1000.0000 ug | INTRAVENOUS | Status: DC | PRN
  Administered 2013-08-26: 4 ug/min via INTRAVENOUS

## 2013-08-26 MED ORDER — ONDANSETRON HCL 4 MG/2ML IJ SOLN: 4.0000 mg | Freq: Four times a day (QID) | INTRAMUSCULAR | Status: DC | PRN

## 2013-08-26 MED ORDER — OFF THE BEAT BOOK
Freq: Once | Status: AC
  Administered 2013-08-26: 19:00:00
  Filled 2013-08-26: qty 1

## 2013-08-26 MED ORDER — ONDANSETRON HCL 4 MG/2ML IJ SOLN
INTRAMUSCULAR | Status: DC | PRN
  Administered 2013-08-26: 4 mg via INTRAVENOUS

## 2013-08-26 MED ORDER — PROPOFOL INFUSION 10 MG/ML OPTIME
INTRAVENOUS | Status: DC | PRN
  Administered 2013-08-26: 25 ug/kg/min via INTRAVENOUS

## 2013-08-26 MED ORDER — PROPOFOL 10 MG/ML IV BOLUS
INTRAVENOUS | Status: DC | PRN
  Administered 2013-08-26: 30 mg via INTRAVENOUS
  Administered 2013-08-26 (×2): 20 mg via INTRAVENOUS

## 2013-08-26 MED ORDER — SODIUM CHLORIDE 0.9 % IV SOLN
INTRAVENOUS | Status: DC
  Administered 2013-08-26: 1000 mL via INTRAVENOUS

## 2013-08-26 MED ORDER — HYDROCODONE-ACETAMINOPHEN 5-325 MG PO TABS: 1.0000 | ORAL_TABLET | ORAL | Status: DC | PRN

## 2013-08-26 MED ORDER — SODIUM CHLORIDE 0.9 % IV SOLN
INTRAVENOUS | Status: DC | PRN
  Administered 2013-08-26: 07:00:00 via INTRAVENOUS

## 2013-08-26 MED ORDER — SODIUM CHLORIDE 0.9 % IV SOLN: 250.0000 mL | INTRAVENOUS | Status: DC | PRN

## 2013-08-26 MED ORDER — FENTANYL CITRATE 0.05 MG/ML IJ SOLN
INTRAMUSCULAR | Status: DC | PRN
  Administered 2013-08-26: 50 ug via INTRAVENOUS
  Administered 2013-08-26: 25 ug via INTRAVENOUS
  Administered 2013-08-26: 50 ug via INTRAVENOUS
  Administered 2013-08-26: 25 ug via INTRAVENOUS
  Administered 2013-08-26 (×2): 50 ug via INTRAVENOUS

## 2013-08-26 MED ORDER — SODIUM CHLORIDE 0.9 % IJ SOLN: 3.0000 mL | INTRAMUSCULAR | Status: DC | PRN

## 2013-08-26 MED ORDER — SODIUM CHLORIDE 0.9 % IJ SOLN: 3.0000 mL | Freq: Two times a day (BID) | INTRAMUSCULAR | Status: DC

## 2013-08-26 MED ORDER — ISOPROTERENOL HCL 0.2 MG/ML IJ SOLN
INTRAMUSCULAR | Status: AC
Start: 2013-08-26 — End: 2013-08-26
  Filled 2013-08-26: qty 5

## 2013-08-26 MED ORDER — BUPIVACAINE HCL (PF) 0.25 % IJ SOLN
INTRAMUSCULAR | Status: AC
  Filled 2013-08-26: qty 30

## 2013-08-26 NOTE — H&P (View-Only) (Signed)
Primary Care Physician: Jerlyn Ly, MD Referring Physician:  Dr Glenford Bayley is a 53 y.o. female with a h/o ectopic atrial tachycardia who presents today for EP follow-up.  She reports initially developing tachycardia more than 10 years ago.  She was evaluated by Dr Caryl Comes and underwent EP study which revealed left atrial tachycardia.  She has been treated with metoprolol initially.  Due to hypotension, she was placed on nadolol.  She finds that she has occasional lightheadedness upon climbing stairs.  She attributes this to nadolol.  Though she does not have tachypalpitations, she feels that her exercise tolerance is decreased in her low atrial rhythm. Today, she denies symptoms of chest pain, shortness of breath, orthopnea, PND, lower extremity edema, dizziness, presyncope, syncope, or neurologic sequela. The patient is tolerating medications without difficulties and is otherwise without complaint today.   Past Medical History  Diagnosis Date  . Atrial tachycardia     s/p ablation by Dr Caryl Comes in 2005  . Dyslipidemia    Past Surgical History  Procedure Laterality Date  . Partial hysterectomy  2000  . C-section      x2  . Ablation of dysrhythmic focus  2005    atrial tachycardia ablated by Dr Caryl Comes    Current Outpatient Prescriptions  Medication Sig Dispense Refill  . aspirin 81 MG tablet Take 81 mg by mouth daily.        . citalopram (CELEXA) 20 MG tablet Take 1 tablet by mouth Daily.       . furosemide (LASIX) 20 MG tablet Take 1 tablet by mouth as needed.       Marland Kitchen LOVAZA 1 G capsule Take 1 g by mouth 4 (four) times daily.       . meloxicam (MOBIC) 7.5 MG tablet Take 7.5 mg by mouth daily.       . Multiple Vitamin (MULTIVITAMIN) tablet Take 1 tablet by mouth daily.        . nadolol (CORGARD) 20 MG tablet Take 1 tablet (20 mg total) by mouth daily.  15 tablet  0  . NEXIUM 40 MG capsule Take 40 mg by mouth daily after supper.      Marland Kitchen SIMCOR 750-20 MG 24 hr tablet Take 1  tablet by mouth 2 (two) times daily.        No current facility-administered medications for this visit.    No Known Allergies  History   Social History  . Marital Status: Married    Spouse Name: N/A    Number of Children: N/A  . Years of Education: N/A   Occupational History  . Not on file.   Social History Main Topics  . Smoking status: Never Smoker   . Smokeless tobacco: Never Used  . Alcohol Use: 0.6 oz/week    1 Glasses of wine per week     Comment: occasional  . Drug Use: No  . Sexual Activity: Not on file   Other Topics Concern  . Not on file   Social History Narrative   Lives in [place] with spouse.  Works in [employer] but is presently unemployed    Family History  Problem Relation Age of Onset  . Coronary artery disease    . Heart disease Father     ROS- All systems are reviewed and negative except as per the HPI above  Physical Exam: Filed Vitals:   08/06/13 1520  BP: 109/71  Pulse: 75  Height: 5' 4.5" (1.638 m)  Weight: 145  lb 3.2 oz (65.862 kg)    GEN- The patient is well appearing, alert and oriented x 3 today.   Head- normocephalic, atraumatic Eyes-  Sclera clear, conjunctiva pink Ears- hearing intact Oropharynx- clear Neck- supple, no JVP Lymph- no cervical lymphadenopathy Lungs- Clear to ausculation bilaterally, normal work of breathing Heart- Regular rate and rhythm, no murmurs, rubs or gallops, PMI not laterally displaced GI- soft, NT, ND, + BS Extremities- no clubbing, cyanosis, or edema MS- no significant deformity or atrophy Skin- no rash or lesion Psych- euthymic mood, full affect Neuro- strength and sensation are intact  EKG today reveals an ectopic atrial rhythm, no ischemic changes GXT performed in the office today reveals that at 90-95 bpm that her sinus node takes over and she is able to achieve 92% of maximal predicted heart rate  (155 bpm) on her home regiment of nadolol.  She exercised to stage 4 for a total exercise  time of 10:02 minutes on a standard bruce protocol without ischemic changes.  During recovery, at 90 bpm, her ectopic atrial rhythm took over and became her resting rhythm  Echo is pending  Assessment and Plan:  1. Atrial tachycardia/ ectopic atrial rhythm The patient has been symptomatic with her ectopic atrial rhythm.  She also reports some symptoms with nadolol.  She is dissatisfied with her present quality of life and would like to reconsider ablation. Therapeutic strategies for supraventricular tachycardia including medicine and ablation were discussed in detail with the patient today. Risk, benefits, and alternatives to EP study and radiofrequency ablation were also discussed in detail today. These risks include but are not limited to stroke, bleeding, vascular damage, tamponade, perforation, damage to the heart and other structures, AV block requiring pacemaker, worsening renal function, and death. The patient understands these risk and wishes to proceed.  We will therefore proceed with catheter ablation at the next available time.  Carto, intracardiac echo and anesthesia are required for this procedure.

## 2013-08-26 NOTE — Brief Op Note (Signed)
Ectopic atrial focus ablated along there tricuspid valve annulus at the 6 o'clock position No inducible arrhythmias post ablation

## 2013-08-26 NOTE — Interval H&P Note (Signed)
History and Physical Interval Note:  08/26/2013 7:35 AM  Cathy Bruce  has presented today for surgery, with the diagnosis of Afib  The various methods of treatment have been discussed with the patient and family. After consideration of risks, benefits and other options for treatment, the patient has consented to  Procedure(s): ATRIAL FIBRILLATION ABLATION (N/A) as a surgical intervention .  The patient's history has been reviewed, patient examined, no change in status, stable for surgery.  I have reviewed the patient's chart and labs.  Questions were answered to the patient's satisfaction.     [name]

## 2013-08-26 NOTE — Transfer of Care (Signed)
Immediate Anesthesia Transfer of Care Note  Patient: Cathy Bruce  Procedure(s) Performed: Procedure(s): ATRIAL FIBRILLATION ABLATION (N/A)  Patient Location: PACU  Anesthesia Type:MAC  Level of Consciousness: awake, alert , oriented and sedated  Airway & Oxygen Therapy: Patient Spontanous Breathing and Patient connected to face mask oxygen  Post-op Assessment: Report given to PACU RN, Post -op Vital signs reviewed and stable and Patient moving all extremities  Post vital signs: Reviewed and stable  Complications: No apparent anesthesia complications

## 2013-08-26 NOTE — Discharge Instructions (Signed)
No driving for 4 days. No lifting over 5 lbs for 1 week. No sexual activity for 1 week. You may return to work in 1 week. Keep procedure site clean & dry. If you notice increased pain, swelling, bleeding or pus, call/return!  You may shower, but no soaking baths/hot tubs/pools for 1 week.  ° ° °

## 2013-08-26 NOTE — Anesthesia Procedure Notes (Signed)
Procedure Name: MAC Date/Time: 08/26/2013 7:39 AM Performed by: Scheryl Darter Pre-anesthesia Checklist: Patient identified, Emergency Drugs available, Suction available, Patient being monitored and Timeout performed Patient Re-evaluated:Patient Re-evaluated prior to inductionOxygen Delivery Method: Simple face mask Preoxygenation: Pre-oxygenation with 100% oxygen Intubation Type: IV induction Placement Confirmation: positive ETCO2 and breath sounds checked- equal and bilateral

## 2013-08-26 NOTE — Preoperative (Signed)
Beta Blockers   Reason not to administer Beta Blockers:Not Applicable 

## 2013-08-26 NOTE — Anesthesia Postprocedure Evaluation (Signed)
  Anesthesia Post-op Note  Patient: Cathy Bruce  Procedure(s) Performed: Procedure(s): ATRIAL FIBRILLATION ABLATION (N/A)  Patient Location: PACU  Anesthesia Type:MAC  Level of Consciousness: awake, alert , oriented and patient cooperative  Airway and Oxygen Therapy: Patient Spontanous Breathing  Post-op Pain: none  Post-op Assessment: Post-op Vital signs reviewed, Patient's Cardiovascular Status Stable, Respiratory Function Stable, Patent Airway, No signs of Nausea or vomiting and Pain level controlled  Post-op Vital Signs: stable  Complications: No apparent anesthesia complications

## 2013-08-26 NOTE — Anesthesia Preprocedure Evaluation (Signed)
Anesthesia Evaluation  Patient identified by MRN, date of birth, ID band Patient awake    Reviewed: Allergy & Precautions, H&P , NPO status , Patient's Chart, lab work & pertinent test results  Airway       Dental   Pulmonary          Cardiovascular + dysrhythmias Atrial Fibrillation     Neuro/Psych    GI/Hepatic   Endo/Other    Renal/GU      Musculoskeletal   Abdominal   Peds  Hematology   Anesthesia Other Findings   Reproductive/Obstetrics                           Anesthesia Physical Anesthesia Plan  ASA: III  Anesthesia Plan: General   Post-op Pain Management:    Induction: Intravenous  Airway Management Planned: LMA  Additional Equipment:   Intra-op Plan:   Post-operative Plan: Extubation in OR  Informed Consent: I have reviewed the patients History and Physical, chart, labs and discussed the procedure including the risks, benefits and alternatives for the proposed anesthesia with the patient or authorized representative who has indicated his/her understanding and acceptance.     Plan Discussed with:   Anesthesia Plan Comments:         Anesthesia Quick Evaluation

## 2013-08-26 NOTE — Progress Notes (Signed)
Doing very well s/p ablation No recurrent arrhythmia post ablation No complaints at this time  Discharge to home. Return to see me in the office in 4-5 weeks

## 2013-08-27 ENCOUNTER — Ambulatory Visit: Payer: Managed Care, Other (non HMO) | Admitting: Internal Medicine

## 2013-08-27 ENCOUNTER — Other Ambulatory Visit (HOSPITAL_COMMUNITY): Payer: Managed Care, Other (non HMO)

## 2013-08-27 NOTE — Op Note (Signed)
Cathy Bruce, DOLSON NO.:  [id number]  MEDICAL RECORD NO.:  [id number]  LOCATION:  [id number]                        FACILITY:  [facility]  PHYSICIAN:  Recore Grayer, MD       DATE OF BIRTH:  November 22, 1960  DATE OF PROCEDURE:  08/26/2013 DATE OF DISCHARGE:  08/26/2013                              OPERATIVE REPORT   SURGEON:  Rasmussen Grayer, MD.  PREPROCEDURE DIAGNOSIS:  Ectopic atrial tachycardia.  POSTPROCEDURE DIAGNOSIS:  Ectopic atrial tachycardia.  PROCEDURES: 1. Comprehensive EP study. 2. Coronary sinus pacing and recording. 3. Three-dimensional mapping of SVT. 4. Radiofrequency ablation of SVT. 5. Arrhythmia induction with isoproterenol infusion.  INTRODUCTION:  Cathy Bruce is a very pleasant 53 year old female with a history of recurrent symptomatic ectopic atrial tachycardia.  The patient has previously undergone an EP study which demonstrates that this is a focal ectopic atrial rhythm.  She has failed medical therapy with beta blockers.  She presents today for EP study and radiofrequency ablation.  DESCRIPTION OF THE PROCEDURE:  Informed written consent was obtained and the patient was brought to the electrophysiology lab in the fasting state.  She was adequately sedated with intravenous medications as outlined in the anesthesia report.  The patient's right groin was prepped and draped in usual sterile fashion by the EP lab staff.  Using a percutaneous Seldinger technique, one 6, one 7, and one 8-French hemostasis sheaths were placed in the right common femoral vein.  A 7- [employer] coronary sinus catheter was introduced through the right common femoral vein and advanced into the coronary sinus for recording and pacing from this location.  A 6-French quadripolar Josephson catheter was introduced through the right common femoral vein and advanced into the right ventricle for recording and pacing.  This catheter was then pulled back to the His  bundle location.  The patient presented to the Electrophysiology Lab in an ectopic atrial rhythm. This was the patient's clinical arrhythmias as compared to prior EKGs. P-waves were negative in the inferior leads.  The average RR interval was 776 milliseconds.  The PR interval measured 128 milliseconds with a QRS duration of 86 milliseconds and a QT interval of 392 milliseconds. Her AH interval measured 58 milliseconds with an HV interval of 43 milliseconds.  Ventricular pacing was performed, which revealed midline concentric decremental VA conduction with a VA Wenckebach cycle length of 300 milliseconds with no arrhythmias observed.  Ventricular extra stimulus testing was performed, which revealed concentric decremental VA conduction with no retrograde jumps, echo beats, or tachycardias observed.  The ventricular ERP was 500/260 milliseconds.  Rapid atrial pacing was performed, which revealed PR greater than RR but no arrhythmias observed when pacing down to a cycle length of 250 milliseconds.  Atrial extra stimulus testing was performed, which revealed decremental AV conduction with no AH jumps, echo beats, or tachycardias observed.  In order to sustain the ectopic atrial arrhythmia, the patient received  Lopressor 5 mg IV x3 to suppress the sinus rhythm which did increase during the procedure.  Neo-Synephrine was also given in a single 50 mcg bolus.  With these maneuvers, the patient was able to maintain the ectopic atrial rhythm.  A 7-French [employer]  [place]ster 4-mm ablation catheter was introduced through the right common femoral vein and advanced into the right atrium.  Three- dimensional electroanatomical mapping of the ectopic atrial rhythm was performed using the [employer].  This demonstrated that the tachycardia arose from the tricuspid valve anulus at approximately the 6 o'clock position.  This was a focal tachycardia.  A series of 11 total radiofrequency applications  were delivered with a target temperature of 60 degrees at 50 watts each for up to 3 minutes in duration.  After the seventh radiofrequency application, the tachycardia was re-mapped with a new activation map generated.  The tachycardia clearly arose from the tricuspid valve anulus at the 6 o'clock position.  Following before the eleventh radiofrequency application, the most suitable site for ablation was observed.  A very prominent QS electrogram was observed on the distal ablation electrode and proceeded the surface P-wave 42 milliseconds.  A 3 minute radiofrequency RF application was delivered. Flurries of automaticity were observed followed by cessation of the ectopic atrial rhythm.  The patient was observed without return of the ectopic atrial focus.  Following ablation, isoproterenol was infused up to 4 mcg/minute.  During isoproterenol infusion, rapid atrial pacing was performed, which again revealed PR greater than RR, but with no tachycardias induced and pacing down to a cycle length of 250 milliseconds.  Atrial extra stimulus testing was performed, which revealed no AH jumps, echo beats, or tachycardias.  The atrial ERP was 500/230 milliseconds.  Following ablation, the AV Wenckebach cycle length was 320 milliseconds.  Prior to the procedure, it was 300 milliseconds.  Following ablation, the AH interval measured 70 milliseconds with an HV interval of 38 milliseconds.  The patient was observed for 20 minutes without return of the ectopic atrial focus.  The patient was observed during isoproterenol washout to not have return of the focus.  No further arrhythmias were induced today.  The procedure was therefore considered completed.  All catheters were removed and the sheaths were aspirated and flushed.  The sheaths were removed and hemostasis was assured.  There were no early apparent complications.  CONCLUSIONS: 1. Ectopic atrial rhythm upon presentation successfully ablated  along     the tricuspid valve anulus along the 6 o'clock position with     extensive radiofrequency current delivered 2. No inducible arrhythmias following ablation. 3. No accessory pathways or clear evidence of dual AV nodal     physiology. 4. No early apparent complications.     Jeancharles Grayer, MD     JA/MEDQ  D:  08/26/2013  T:  08/27/2013  Job:  [id number]  cc:   Deboraha Sprang, MD, [facility]

## 2013-08-28 ENCOUNTER — Other Ambulatory Visit (HOSPITAL_COMMUNITY): Payer: Managed Care, Other (non HMO)

## 2013-10-06 ENCOUNTER — Encounter: Payer: Managed Care, Other (non HMO) | Admitting: Internal Medicine

## 2013-10-20 ENCOUNTER — Ambulatory Visit (INDEPENDENT_AMBULATORY_CARE_PROVIDER_SITE_OTHER): Payer: Managed Care, Other (non HMO) | Admitting: Internal Medicine

## 2013-10-20 ENCOUNTER — Encounter: Payer: Self-pay | Admitting: Internal Medicine

## 2013-10-20 VITALS — BP 121/68 | HR 90 | Ht 64.0 in | Wt 146.0 lb

## 2013-10-20 DIAGNOSIS — R0609 Other forms of dyspnea: Secondary | ICD-10-CM

## 2013-10-20 DIAGNOSIS — I471 Supraventricular tachycardia: Secondary | ICD-10-CM

## 2013-10-20 DIAGNOSIS — R0989 Other specified symptoms and signs involving the circulatory and respiratory systems: Secondary | ICD-10-CM

## 2013-10-20 DIAGNOSIS — I498 Other specified cardiac arrhythmias: Secondary | ICD-10-CM

## 2013-10-20 NOTE — Patient Instructions (Signed)
Your physician recommends that you schedule a follow-up appointment in: 3 months with Dr Caryl Comes  Your physician has recommended you make the following change in your medication:  1) Stop Aspirin 2) Stop Potassium  Have repeat labs with Dr Joylene Draft in 10 days---will need a BMP

## 2013-10-21 ENCOUNTER — Encounter: Payer: Self-pay | Admitting: Internal Medicine

## 2013-10-21 NOTE — Progress Notes (Signed)
PCP: Jerlyn Ly, MD Primary Cardiologist:  Dr Glenford Bayley is a 53 y.o. female who presents today for routine electrophysiology followup.  Since her recent ablation, the patient reports doing very well.  She denies any further tachycardia and is doing well off of nadolol.  Today, she denies symptoms of palpitations, chest pain,  lower extremity edema, dizziness, presyncope, or syncope.  She continues to notice mild SOB only when climbing stairs.  She feels that this is improving.  The patient is otherwise without complaint today.   Past Medical History  Diagnosis Date  . Atrial tachycardia     s/p ablation by Dr Caryl Comes in 2005  . Dyslipidemia   . GERD (gastroesophageal reflux disease)    Past Surgical History  Procedure Laterality Date  . Partial hysterectomy  2000  . C-section      x2  . Ablation of dysrhythmic focus  2005    atrial tachycardia ablated by Dr Caryl Comes  . Ablation of dysrhythmic focus  08/26/2013    atrial tachycardia ablated by Dr Rayann Heman, focus located along the tricuspid valve annulus    Current Outpatient Prescriptions  Medication Sig Dispense Refill  . citalopram (CELEXA) 20 MG tablet Take 1 tablet by mouth Daily.       . cycloSPORINE (RESTASIS) 0.05 % ophthalmic emulsion Place 1 drop into both eyes 2 (two) times daily.      . furosemide (LASIX) 20 MG tablet Take 1 tablet by mouth daily as needed for fluid.       Marland Kitchen HYDROcodone-homatropine (HYCODAN) 5-1.5 MG/5ML syrup as needed.      Marland Kitchen LOVAZA 1 G capsule Take 1 g by mouth 4 (four) times daily.       . meloxicam (MOBIC) 7.5 MG tablet Take 7.5 mg by mouth daily.       . Multiple Vitamin (MULTIVITAMIN) tablet Take 1 tablet by mouth daily.        Marland Kitchen NEXIUM 40 MG capsule Take 40 mg by mouth 2 (two) times daily before a meal.       . SIMCOR 750-20 MG 24 hr tablet Take 1 tablet by mouth 2 (two) times daily.        No current facility-administered medications for this visit.    Physical Exam: Filed Vitals:    10/20/13 1640  BP: 121/68  Pulse: 90  Height: 5\' 4"  (1.626 m)  Weight: 146 lb (66.225 kg)    GEN- The patient is well appearing, alert and oriented x 3 today.   Head- normocephalic, atraumatic Eyes-  Sclera clear, conjunctiva pink Ears- hearing intact Oropharynx- clear Lungs- Clear to ausculation bilaterally, normal work of breathing Heart- Regular rate and rhythm, no murmurs, rubs or gallops, PMI not laterally displaced GI- soft, NT, ND, + BS Extremities- no clubbing, cyanosis, or edema  ekg today reveals sinus rhythm 83 bpm, PR 124, otherwise normal ekg  Assessment and Plan:  1. atach Resolved s/p ablation  2. Hypokalemia She reports K was normal on recent BMET by Dr Joylene Draft.  She does not take lasix. Stop K and repeat BMET with PCP In 2 weeks.  I will defer further management to Dr Joylene Draft  She has no real indication for ASA.  I will therefore stop this medicine today  3. DOE Improving with regular exercise No further workup is planned presently  Return to see Dr Caryl Comes in 3 months I will see as needed going forward

## 2014-01-22 ENCOUNTER — Ambulatory Visit: Payer: Managed Care, Other (non HMO) | Admitting: Internal Medicine

## 2014-03-04 ENCOUNTER — Ambulatory Visit: Payer: Managed Care, Other (non HMO) | Admitting: Internal Medicine

## 2014-03-06 ENCOUNTER — Encounter: Payer: Self-pay | Admitting: Internal Medicine

## 2014-04-22 ENCOUNTER — Ambulatory Visit (INDEPENDENT_AMBULATORY_CARE_PROVIDER_SITE_OTHER): Payer: Managed Care, Other (non HMO) | Admitting: Internal Medicine

## 2014-04-22 ENCOUNTER — Encounter: Payer: Self-pay | Admitting: Internal Medicine

## 2014-04-22 VITALS — BP 117/69 | HR 82 | Ht 64.0 in | Wt 145.0 lb

## 2014-04-22 DIAGNOSIS — I471 Supraventricular tachycardia: Secondary | ICD-10-CM

## 2014-04-22 DIAGNOSIS — I498 Other specified cardiac arrhythmias: Secondary | ICD-10-CM

## 2014-04-22 NOTE — Progress Notes (Signed)
.        Patient Care Team: Jerlyn Ly, MD as PCP - General (Internal Medicine)   HPI  Cathy Bruce is a 53 y.o. female Seen in followup for ablated of low septal tachycardia with residual activity She underwent repeat ablation by Dr. Greggory Brandy 3/15  She has had no repeat tachycardia   She has family history of malignant hyperlipidemia with premature maternal death   She continues to have heart rates in the 80-90 and recently noted DOE on stairs   Past Medical History  Diagnosis Date  . Atrial tachycardia     s/p ablation by Dr Caryl Comes in 2005  . Dyslipidemia   . GERD (gastroesophageal reflux disease)     Past Surgical History  Procedure Laterality Date  . Partial hysterectomy  2000  . C-section      x2  . Ablation of dysrhythmic focus  2005    atrial tachycardia ablated by Dr Caryl Comes  . Ablation of dysrhythmic focus  08/26/2013    atrial tachycardia ablated by Dr Rayann Heman, focus located along the tricuspid valve annulus    Current Outpatient Prescriptions  Medication Sig Dispense Refill  . aspirin 81 MG tablet Take 81 mg by mouth daily.      . citalopram (CELEXA) 10 MG tablet Take 10 mg by mouth daily.      . cycloSPORINE (RESTASIS) 0.05 % ophthalmic emulsion Place 1 drop into both eyes 2 (two) times daily.      Marland Kitchen HYDROcodone-homatropine (HYCODAN) 5-1.5 MG/5ML syrup Take 5 mLs by mouth daily as needed for cough.       Marland Kitchen LOVAZA 1 G capsule Take 1 g by mouth 2 (two) times daily.       . Multiple Vitamin (MULTIVITAMIN) tablet Take 1 tablet by mouth daily.        Marland Kitchen omeprazole (PRILOSEC) 40 MG capsule Take 40 mg by mouth daily.      Marland Kitchen SIMCOR 750-20 MG 24 hr tablet Take 2 tablets by mouth 2 (two) times daily.        No current facility-administered medications for this visit.    No Known Allergies  Review of Systems negative except from HPI and PMH  Physical Exam BP 117/69  Pulse 82  Ht 5\' 4"  (1.626 m)  Wt 145 lb (65.772 kg)  BMI 24.88 kg/m2 Well developed and  well nourished in no acute distress HENT normal E scleral and icterus clear Neck Supple JVP flat; carotids brisk and full Clear to ausculation  Regular rate and rhythm, no murmurs gallops or rub Soft with active bowel sounds No clubbing cyanosis none Edema Alert and oriented, grossly normal motor and sensory function Skin Warm and Dry    Assessment and  Plan  Atrial tachycardia  S/p successful ablation  3/15  Hyperlipidemia  I am thrilled that her tachypalpitations problems resolved.  I've asked her to raise with Dr. Joylene Draft the issue of niacin therapy. There are scant data as to its benefits and concerns from recent trials as to its deficits;  these issues further enhanced in the year of [employer] 9    We will see her as needed

## 2014-04-22 NOTE — Patient Instructions (Signed)
Your physician recommends that you continue on your current medications as directed. Please refer to the Current Medication list given to you today.  No follow up needed at this time with Dr. Caryl Comes. He will see you on an as needed basis.

## 2014-05-15 ENCOUNTER — Other Ambulatory Visit: Payer: Self-pay

## 2014-07-09 ENCOUNTER — Encounter (HOSPITAL_COMMUNITY): Payer: Self-pay | Admitting: Internal Medicine

## 2015-08-01 HISTORY — PX: COLONOSCOPY: SHX174

## 2015-12-16 LAB — IFOBT (OCCULT BLOOD): IFOBT: POSITIVE

## 2016-02-29 ENCOUNTER — Encounter: Payer: Self-pay | Admitting: *Deleted

## 2016-03-08 ENCOUNTER — Telehealth: Payer: Self-pay | Admitting: Internal Medicine

## 2016-03-08 NOTE — Telephone Encounter (Signed)
New message      Pt is having rapid heartbeat---averaging 100 bpm. She had an ablation approx  60yrs ago.  Can she come in for an EKG or will Dr Caryl Comes need to see her?

## 2016-03-08 NOTE — Telephone Encounter (Signed)
Spoke with pt who is concerned about her HR.  She has been checking it every 6 to 8 hours for the last month and it has "consistently been 95 to 100 bpm."  It has been regular.  She states her normal HR is usually between 75 to 85 bpm.  She does report a slight increase in SOB with activity like getting the groceries in or walking up stairs.  Advised normal heart rate is 60 to 100 bpm.  Advised she can continue to monitor to and let us know if it should become irregular or greater than 100 bpm.  Reviewed medications with patient.  She would like to know if Dr Caryl Comes wants her to come in for an EKG.  Advised I will forward this information to Dr Caryl Comes for review and she is aware I will c/b with further recommendations.

## 2016-03-09 NOTE — Telephone Encounter (Signed)
Pam  Would be great to have her come for ECG thx

## 2016-03-10 NOTE — Telephone Encounter (Signed)
Follow UP     Mrs. Buttry is asking if you can give her a call as soon as possible at [phone]

## 2016-03-10 NOTE — Telephone Encounter (Signed)
I called and spoke with the patient. She wanted to clarify how urgent it was for her elevated HR's. Advised normal HR is 60-100. She reports not going above 100 bpm, but has had a steady increase from the mid 80's to around 100 bpm over the last few weeks. I advised, no urgency, but will have her come in next week for an EKG. She is agreeable. Scheduled for 8/17 at 2:00 pm when Dr. Caryl Comes will be in the office.

## 2016-03-10 NOTE — Telephone Encounter (Signed)
Spoke with pt to have her come in today for an ekg.  She states she is in [place] until Tuesday and can not come in until after that.  She reports feeling fine with a HR  Between 95-100 bpm. Advised I will forward this information to Dr Lequita Halt and his nurse and she will be contacted for an appt one day next week.  She states understanding and feels good about this plan.

## 2016-03-15 ENCOUNTER — Ambulatory Visit (INDEPENDENT_AMBULATORY_CARE_PROVIDER_SITE_OTHER): Payer: BLUE CROSS/BLUE SHIELD | Admitting: Internal Medicine

## 2016-03-15 ENCOUNTER — Encounter: Payer: Self-pay | Admitting: Internal Medicine

## 2016-03-15 VITALS — BP 128/78 | HR 108 | Ht 64.0 in | Wt 149.5 lb

## 2016-03-15 DIAGNOSIS — R195 Other fecal abnormalities: Secondary | ICD-10-CM | POA: Diagnosis not present

## 2016-03-15 DIAGNOSIS — K5900 Constipation, unspecified: Secondary | ICD-10-CM

## 2016-03-15 DIAGNOSIS — K648 Other hemorrhoids: Secondary | ICD-10-CM | POA: Diagnosis not present

## 2016-03-15 DIAGNOSIS — K5909 Other constipation: Secondary | ICD-10-CM

## 2016-03-15 MED ORDER — HYDROCORTISONE ACETATE 25 MG RE SUPP: 25.0000 mg | Freq: Two times a day (BID) | RECTAL | 0 refills | Status: DC

## 2016-03-15 MED ORDER — NA SULFATE-K SULFATE-MG SULF 17.5-3.13-1.6 GM/177ML PO SOLN: ORAL | 0 refills | Status: DC

## 2016-03-15 NOTE — Patient Instructions (Signed)
You have been scheduled for a colonoscopy. Please follow written instructions given to you at your visit today.  Please pick up your prep supplies at the pharmacy within the next 1-3 days. If you use inhalers (even only as needed), please bring them with you on the day of your procedure. Your physician has requested that you go to www.startemmi.com and enter the access code given to you at your visit today. This web site gives a general overview about your procedure. However, you should still follow specific instructions given to you by our office regarding your preparation for the procedure.  We have sent the following medications to your pharmacy for you to pick up at your convenience: Hydrocortisone twice daily x 5 days  Please purchase the following medications over the counter and take as directed: Recticare-per box instructions  If you are age 57 or older, your body mass index should be between 23-30. Your Body mass index is 25.66 kg/m. If this is out of the aforementioned range listed, please consider follow up with your Primary Care Provider.  If you are age 59 or younger, your body mass index should be between 19-25. Your Body mass index is 25.66 kg/m. If this is out of the aformentioned range listed, please consider follow up with your Primary Care Provider.

## 2016-03-15 NOTE — Progress Notes (Signed)
Patient ID: Cathy Bruce, female   DOB: 12/26/1960, 55 y.o.   MRN: [id number] HPI: Chavon Dush is a 55 year old female with a past medical history of colonic diverticulosis, constipation, internal hemorrhoids, GERD, atrial tachycardia status post ablation in 2005, dyslipidemia is seen in consultation at the request of Dr. Joylene Draft for heme positive stool. She is here alone today.  She reports at annual physical in May 2007 she had a hemo-sure test performed which was positive. This led to this visit. She denies seeing visible blood in her stool or melena. She states 1 week ago she developed a flare of her hemorrhoids which is her first flare in years. She's had painful swollen hemorrhoidal tissue her the last 10 days. No blood in her stool and no itching. She denies abdominal pain. Good appetite. No weight loss. No upper GI complaint including no heartburn, dysphagia or odynophagia. She does use omeprazole 40 mg daily which she states controls previous heartburn very well. She denies a family history of GI tract malignancy in first degree relatives. She notes 1 first cousin with colon cancer.  For her constipation she uses Ex-Lax 4 tablets every other day. She reports this works very well for her. Dr. Joylene Draft tried her on Linzess but she develop side effects. She's been using this regimen for her constipation for 3 years and is happy with it. She previously also tried Metamucil without good response.  She had screening colonoscopy performed on 07/18/2011. This exam was to the terminal ileum with a good prep. It showed mild diverticulosis at the hepatic flexure, descending and sigmoid colon. Retroflexion revealed hypertrophied anal papillae and internal hemorrhoids. No polyps were seen.  Past Medical History:  Diagnosis Date  . ADD (attention deficit disorder)   . Atrial tachycardia [facility])    s/p ablation by Dr Caryl Comes in 2005  . DDD (degenerative disc disease), lumbar   . Diverticulosis   .  Dyslipidemia   . GERD (gastroesophageal reflux disease)   . Internal hemorrhoids   . Social anxiety disorder   . SVT (supraventricular tachycardia) (HCC)     Past Surgical History:  Procedure Laterality Date  . ABLATION OF DYSRHYTHMIC FOCUS  2005   atrial tachycardia ablated by Dr Caryl Comes  . ABLATION OF DYSRHYTHMIC FOCUS  08/26/2013   atrial tachycardia ablated by Dr Rayann Heman, focus located along the tricuspid valve annulus  . ATRIAL FIBRILLATION ABLATION N/A 08/26/2013   Procedure: ATRIAL FIBRILLATION ABLATION;  Surgeon: Coralyn Mark, MD;  Location: [facility];  Service: Cardiovascular;  Laterality: N/A;  . c-section     x2  . [place]  2002  . PARTIAL HYSTERECTOMY  2000  . TUBAL LIGATION      Outpatient Medications Prior to Visit  Medication Sig Dispense Refill  . aspirin 81 MG tablet Take 81 mg by mouth daily.    Marland Kitchen ezetimibe (ZETIA) 10 MG tablet Take 10 mg by mouth daily.    Marland Kitchen LOVAZA 1 G capsule Take 1 g by mouth 2 (two) times daily.     . Multiple Vitamin (MULTIVITAMIN) tablet Take 1 tablet by mouth daily.      Marland Kitchen omeprazole (PRILOSEC) 40 MG capsule Take 40 mg by mouth daily.    Marland Kitchen SIMCOR 750-20 MG 24 hr tablet Take 2 tablets by mouth 2 (two) times daily.     . citalopram (CELEXA) 10 MG tablet Take 10 mg by mouth daily.    . cycloSPORINE (RESTASIS) 0.05 % ophthalmic emulsion Place 1 drop into  both eyes 2 (two) times daily.    Marland Kitchen HYDROcodone-homatropine (HYCODAN) 5-1.5 MG/5ML syrup Take 5 mLs by mouth daily as needed for cough.      No facility-administered medications prior to visit.     No Known Allergies  Family History  Problem Relation Age of Onset  . Heart disease Father   . Lung cancer Father   . Hypertension Father   . Coronary artery disease    . CAD Mother   . Peripheral vascular disease Mother   . Dementia Mother   . Melanoma Sister   . Heart attack Maternal Grandmother 50  . Heart disease Maternal Aunt     Social History  Substance Use Topics   . Smoking status: Never Smoker  . Smokeless tobacco: Never Used  . Alcohol use 0.6 oz/week    1 Glasses of wine per week     Comment: occasional- once monthly    ROS: As per history of present illness, otherwise negative  BP 128/78 (BP Location: Left Arm, Patient Position: Sitting, Cuff Size: Normal)   Pulse (!) 108   Ht 5\' 4"  (1.626 m) Comment: height measured without shoes  Wt 149 lb 8 oz (67.8 kg)   BMI 25.66 kg/m  Constitutional: Well-developed and well-nourished. No distress. HEENT: Normocephalic and atraumatic. Oropharynx is clear and moist. No oropharyngeal exudate. Conjunctivae are normal.  No scleral icterus. Neck: Neck supple. Trachea midline. Cardiovascular: Normal rate, regular rhythm and intact distal pulses. No M/R/G Pulmonary/chest: Effort normal and breath sounds normal. No wheezing, rales or rhonchi. Abdominal: Soft, nontender, nondistended. Bowel sounds active throughout. There are no masses palpable. No hepatosplenomegaly. Extremities: no clubbing, cyanosis, or edema Lymphadenopathy: No cervical adenopathy noted. Neurological: Alert and oriented to person place and time. Skin: Skin is warm and dry. No rashes noted. Psychiatric: Normal mood and affect. Behavior is normal.  RELEVANT LABS AND IMAGING: Lab work performed in May revealed normal CMP, hemoglobin was normal at 13.6, MCV 89.5, platelet count 250, white cell count normal TSH normal A1c 5.3%  ASSESSMENT/PLAN:  55 year old female with a past medical history of colonic diverticulosis, constipation, internal hemorrhoids, GERD, atrial tachycardia status post ablation in 2005, dyslipidemia is seen in consultation at the request of Dr. Joylene Draft for heme positive stool.   1. Heme + stools -- she had a positive FIT test for blood in May 2017.  He discussed this test and even though she had normal colonoscopy 5 years ago, I recommend we repeat colonoscopy at this time. We discussed the risks, benefits and  alternatives to colonoscopy and not performing colonoscopy and after this discussion she wishes to proceed. This test will be scheduled.  2. Symptomatic hemorrhoids -- hydrocortisone suppository 25 mg twice a day 5 days and RectiCare OTC recommended per box instruction for pain/discomfort. If this becomes a more frequent issue for her  can consider hemorrhoidal banding after colonoscopy  3. Constipation -- currently she is happy with the use of bisacodyl every other day. Will not change this regimen given previous nonresponse and side effects with Linzess and Metamucil.      [id number] Joylene Draft, [place], [place] [address]

## 2016-03-16 ENCOUNTER — Ambulatory Visit (INDEPENDENT_AMBULATORY_CARE_PROVIDER_SITE_OTHER): Payer: BLUE CROSS/BLUE SHIELD | Admitting: *Deleted

## 2016-03-16 VITALS — BP 140/84 | HR 99 | Ht 64.0 in | Wt 151.5 lb

## 2016-03-16 DIAGNOSIS — I471 Supraventricular tachycardia: Secondary | ICD-10-CM

## 2016-03-16 NOTE — Progress Notes (Signed)
1.) Reason for visit: 12 lead EKG  2.) Name of MD requesting visit: Dr. Caryl Comes  3.) H&P: Atrial tachycardia S/P repeat ablation by Dr.Alread 3/15  4.) ROS related to problem: Atrial tachycardia  5.) Assessment and plan per MD: Dr. Caryl Comes reviewed EKG. Pt is SR 99 beats/ minute, not Atrial tachycardia.             Pt had a Life line screening test done . Results we sent to her. She is planning to have results send to Dr. Caryl Comes.

## 2016-03-16 NOTE — Patient Instructions (Signed)
Pt is aware that Dr. Francena Hanly reviwed EKG, and that she is in SR 99 beats/minute. Pt verbalized understanding.

## 2016-03-22 ENCOUNTER — Encounter: Payer: Self-pay | Admitting: Internal Medicine

## 2016-03-22 ENCOUNTER — Ambulatory Visit (AMBULATORY_SURGERY_CENTER): Payer: BLUE CROSS/BLUE SHIELD | Admitting: Internal Medicine

## 2016-03-22 VITALS — BP 124/71 | HR 83 | Temp 97.3°F | Resp 13 | Ht 64.0 in | Wt 149.0 lb

## 2016-03-22 DIAGNOSIS — D122 Benign neoplasm of ascending colon: Secondary | ICD-10-CM

## 2016-03-22 DIAGNOSIS — R195 Other fecal abnormalities: Secondary | ICD-10-CM

## 2016-03-22 DIAGNOSIS — D125 Benign neoplasm of sigmoid colon: Secondary | ICD-10-CM | POA: Diagnosis not present

## 2016-03-22 MED ORDER — SODIUM CHLORIDE 0.9 % IV SOLN: 500.0000 mL | INTRAVENOUS | Status: DC

## 2016-03-22 NOTE — Patient Instructions (Signed)
YOU HAD AN ENDOSCOPIC PROCEDURE TODAY AT THE [facility]:   Refer to the procedure report that was given to you for any specific questions about what was found during the examination.  If the procedure report does not answer your questions, please call your gastroenterologist to clarify.  If you requested that your care partner not be given the details of your procedure findings, then the procedure report has been included in a sealed envelope for you to review at your convenience later.  YOU SHOULD EXPECT: Some feelings of bloating in the abdomen. Passage of more gas than usual.  Walking can help get rid of the air that was put into your GI tract during the procedure and reduce the bloating. If you had a lower endoscopy (such as a colonoscopy or flexible sigmoidoscopy) you may notice spotting of blood in your stool or on the toilet paper. If you underwent a bowel prep for your procedure, you may not have a normal bowel movement for a few days.  Please Note:  You might notice some irritation and congestion in your nose or some drainage.  This is from the oxygen used during your procedure.  There is no need for concern and it should clear up in a day or so.  SYMPTOMS TO REPORT IMMEDIATELY:   Following lower endoscopy (colonoscopy or flexible sigmoidoscopy):  Excessive amounts of blood in the stool  Significant tenderness or worsening of abdominal pains  Swelling of the abdomen that is new, acute  Fever of 100F or higher   For urgent or emergent issues, a gastroenterologist can be reached at any hour by calling [phone].   DIET:  We do recommend a small meal at first, but then you may proceed to your regular diet.  Drink plenty of fluids but you should avoid alcoholic beverages for 24 hours.  ACTIVITY:  You should plan to take it easy for the rest of today and you should NOT DRIVE or use heavy machinery until tomorrow (because of the sedation medicines used during the test).     FOLLOW UP: Our staff will call the number listed on your records the next business day following your procedure to check on you and address any questions or concerns that you may have regarding the information given to you following your procedure. If we do not reach you, we will leave a message.  However, if you are feeling well and you are not experiencing any problems, there is no need to return our call.  We will assume that you have returned to your regular daily activities without incident.  If any biopsies were taken you will be contacted by phone or by letter within the next 1-3 weeks.  Please call us at [phone] if you have not heard about the biopsies in 3 weeks.    SIGNATURES/CONFIDENTIALITY: You and/or your care partner have signed paperwork which will be entered into your electronic medical record.  These signatures attest to the fact that that the information above on your After Visit Summary has been reviewed and is understood.  Full responsibility of the confidentiality of this discharge information lies with you and/or your care-partner.  Polyp, diverticulosis, high fiber diet and hemorrhoid information given. 

## 2016-03-22 NOTE — Progress Notes (Signed)
Called to room to assist during endoscopic procedure.  Patient ID and intended procedure confirmed with present staff. Received instructions for my participation in the procedure from the performing physician.  

## 2016-03-22 NOTE — Op Note (Signed)
[facility] Patient Name: Cathy Bruce Procedure Date: 03/22/2016 7:41 AM MRN: [id number] Endoscopist: Jerene Bears , MD Age: 55 Referring MD:  Date of Birth: 1961-02-25 Gender: Female Account #: [id number] Procedure:                Colonoscopy Indications:              Last colonoscopy: December 2012, Positive fecal                            immunochemical test Medicines:                Monitored Anesthesia Care Procedure:                Pre-Anesthesia Assessment:                           - Prior to the procedure, a History and Physical                            was performed, and patient medications and                            allergies were reviewed. The patient's tolerance of                            previous anesthesia was also reviewed. The risks                            and benefits of the procedure and the sedation                            options and risks were discussed with the patient.                            All questions were answered, and informed consent                            was obtained. Prior Anticoagulants: The patient has                            taken no previous anticoagulant or antiplatelet                            agents. ASA Grade Assessment: II - A patient with                            mild systemic disease. After reviewing the risks                            and benefits, the patient was deemed in                            satisfactory condition to undergo the procedure.  After obtaining informed consent, the colonoscope                            was passed under direct vision. Throughout the                            procedure, the patient's blood pressure, pulse, and                            oxygen saturations were monitored continuously. The                            Model PCF-[id number]) scope was introduced                            through the anus and advanced to the the  cecum,                            identified by appendiceal orifice and ileocecal                            valve. The colonoscopy was performed without                            difficulty. The patient tolerated the procedure                            well. The quality of the bowel preparation was                            good. The ileocecal valve, appendiceal orifice, and                            rectum were photographed. Scope In: 8:37:45 AM Scope Out: 8:56:00 AM Scope Withdrawal Time: 0 hours 12 minutes 21 seconds  Total Procedure Duration: 0 hours 18 minutes 15 seconds  Findings:                 The perianal exam findings include skin tags.                           A 4 mm polyp was found in the ascending colon. The                            polyp was flat with mucus cap. The polyp was                            removed with a cold snare. Resection and retrieval                            were complete.                           Two sessile polyps were found in the sigmoid colon.  The polyps were 2 to 5 mm in size. These polyps                            were removed with a cold snare. Resection and                            retrieval were complete.                           Scattered small-mouthed diverticula were found in                            the sigmoid colon and descending colon.                           Internal hemorrhoids were found during                            retroflexion. The hemorrhoids were small. Complications:            No immediate complications. Estimated Blood Loss:     Estimated blood loss was minimal. Impression:               - Perianal skin tags found on perianal exam.                           - One 4 mm polyp in the ascending colon, removed                            with a cold snare. Resected and retrieved.                           - Two 2 to 5 mm polyps in the sigmoid colon,                             removed with a cold snare. Resected and retrieved.                           - Mild diverticulosis in the sigmoid colon and in                            the descending colon.                           - Internal hemorrhoids. Recommendation:           - Patient has a contact number available for                            emergencies. The signs and symptoms of potential                            delayed complications were discussed with the  patient. Return to normal activities tomorrow.                            Written discharge instructions were provided to the                            patient.                           - Resume previous diet.                           - Continue present medications.                           - Await pathology results.                           - Repeat colonoscopy is recommended. The                            colonoscopy date will be determined after pathology                            results from today's exam become available for                            review. Jerene Bears, MD 03/22/2016 9:08:03 AM This report has been signed electronically.

## 2016-03-22 NOTE — Progress Notes (Signed)
A and O x3. Report to RN. Tolerated MAC anesthesia well. 

## 2016-03-23 ENCOUNTER — Telehealth: Payer: Self-pay | Admitting: *Deleted

## 2016-03-23 NOTE — Telephone Encounter (Signed)
  Follow up Call-  Call back number 03/22/2016  Post procedure Call Back phone  # [phone]  Permission to leave phone message Yes  Some recent data might be hidden     No answer, left message.

## 2016-03-27 ENCOUNTER — Encounter: Payer: Self-pay | Admitting: Internal Medicine

## 2016-03-28 ENCOUNTER — Other Ambulatory Visit: Payer: Self-pay | Admitting: Internal Medicine

## 2016-03-28 DIAGNOSIS — E041 Nontoxic single thyroid nodule: Secondary | ICD-10-CM

## 2016-03-31 ENCOUNTER — Ambulatory Visit
Admission: RE | Admit: 2016-03-31 | Discharge: 2016-03-31 | Disposition: A | Discharge status: Home / Self Care | Payer: BLUE CROSS/BLUE SHIELD | Source: Ambulatory Visit | Attending: Internal Medicine | Admitting: Internal Medicine

## 2016-03-31 DIAGNOSIS — E041 Nontoxic single thyroid nodule: Secondary | ICD-10-CM

## 2017-05-04 ENCOUNTER — Other Ambulatory Visit: Payer: Self-pay | Admitting: Internal Medicine

## 2017-05-04 DIAGNOSIS — E041 Nontoxic single thyroid nodule: Secondary | ICD-10-CM

## 2017-05-10 ENCOUNTER — Ambulatory Visit
Admission: RE | Admit: 2017-05-10 | Discharge: 2017-05-10 | Disposition: A | Discharge status: Home / Self Care | Payer: Managed Care, Other (non HMO) | Source: Ambulatory Visit | Attending: Internal Medicine | Admitting: Internal Medicine

## 2017-05-10 DIAGNOSIS — E041 Nontoxic single thyroid nodule: Secondary | ICD-10-CM

## 2018-03-01 ENCOUNTER — Other Ambulatory Visit: Payer: Self-pay | Admitting: Internal Medicine

## 2018-03-05 ENCOUNTER — Other Ambulatory Visit: Payer: Self-pay | Admitting: Internal Medicine

## 2018-03-05 DIAGNOSIS — E785 Hyperlipidemia, unspecified: Secondary | ICD-10-CM

## 2018-03-12 ENCOUNTER — Other Ambulatory Visit: Payer: Self-pay | Admitting: Internal Medicine

## 2018-03-12 DIAGNOSIS — M549 Dorsalgia, unspecified: Secondary | ICD-10-CM

## 2018-03-12 DIAGNOSIS — M5136 Other intervertebral disc degeneration, lumbar region: Secondary | ICD-10-CM

## 2018-03-13 ENCOUNTER — Other Ambulatory Visit: Payer: Managed Care, Other (non HMO)

## 2018-03-18 ENCOUNTER — Ambulatory Visit
Admission: RE | Admit: 2018-03-18 | Discharge: 2018-03-18 | Disposition: A | Discharge status: Home / Self Care | Payer: Managed Care, Other (non HMO) | Source: Ambulatory Visit | Attending: Internal Medicine | Admitting: Internal Medicine

## 2018-03-18 DIAGNOSIS — E785 Hyperlipidemia, unspecified: Secondary | ICD-10-CM

## 2018-04-10 ENCOUNTER — Other Ambulatory Visit: Payer: Managed Care, Other (non HMO)

## 2018-04-19 ENCOUNTER — Ambulatory Visit: Payer: 59 | Admitting: Cardiology

## 2018-04-24 ENCOUNTER — Ambulatory Visit
Admission: RE | Admit: 2018-04-24 | Discharge: 2018-04-24 | Disposition: A | Discharge status: Home / Self Care | Payer: 59 | Source: Ambulatory Visit | Attending: Internal Medicine | Admitting: Internal Medicine

## 2018-04-24 DIAGNOSIS — M5136 Other intervertebral disc degeneration, lumbar region: Secondary | ICD-10-CM

## 2018-04-24 DIAGNOSIS — M549 Dorsalgia, unspecified: Secondary | ICD-10-CM

## 2018-05-14 ENCOUNTER — Encounter: Payer: Self-pay | Admitting: Cardiology

## 2018-05-14 ENCOUNTER — Ambulatory Visit (INDEPENDENT_AMBULATORY_CARE_PROVIDER_SITE_OTHER): Payer: 59 | Admitting: Cardiology

## 2018-05-14 VITALS — BP 110/75 | HR 82 | Ht 64.0 in | Wt 153.4 lb

## 2018-05-14 DIAGNOSIS — Z01818 Encounter for other preprocedural examination: Secondary | ICD-10-CM

## 2018-05-14 DIAGNOSIS — E785 Hyperlipidemia, unspecified: Secondary | ICD-10-CM

## 2018-05-14 DIAGNOSIS — R931 Abnormal findings on diagnostic imaging of heart and coronary circulation: Secondary | ICD-10-CM | POA: Diagnosis not present

## 2018-05-14 DIAGNOSIS — R0609 Other forms of dyspnea: Secondary | ICD-10-CM

## 2018-05-14 DIAGNOSIS — I471 Supraventricular tachycardia: Secondary | ICD-10-CM | POA: Diagnosis not present

## 2018-05-14 MED ORDER — METOPROLOL TARTRATE 50 MG PO TABS: 100.0000 mg | ORAL_TABLET | Freq: Once | ORAL | 0 refills | Status: DC

## 2018-05-14 NOTE — Progress Notes (Signed)
PCP: Cathy Infante, MD  Clinic Note: Chief Complaint  Patient presents with  . New Patient (Initial Visit)    Abnormal coronary calcium score family history of CAD    HPI: Cathy Bruce is a 57 y.o. female with a PMH below (most notable for history of low septal atrial tachycardia status post ablation by Dr. Rayann Heman in March 2015) who is being seen today for the evaluation of abnormal coronary calcium score at the request of Cathy Infante, MD.  In reviewing her chart, she has a significant family history of CAD with her mother having a bypass surgery as well as her maternal grandmother having a massive heart attack.Cathy Bruce was last seen on August 20 by her PCP.  He ordered a coronary calcium score for baseline screening based on her family history and hyperlipidemia.  Currently on Zetia and simvastatin.  Had been on Lovaza.  Recent Hospitalizations: None  Studies Personally Reviewed - (if available, images/films reviewed: From Epic Chart or Care Everywhere)  03/18/2018 coronary Calcium Score 278.  Notable LM & LAD-LCx calcification.  January 2015 Transthoracic Echo : EF 55 to 60%.  GRII DD.  Mild AI, mild MR.  Interval History: [name] for baseline cardiology evaluation and to discuss the results of her coronary calcium score.  For the most part she is very active walking without any major symptoms.  She says that she has not had any more episodes of tachycardia.  She says off and on her heart rate will be in the 90s to low 100s at rest, but she has not had the SVT/PAT episodes. Right now she is about 4 weeks out from walking pneumonia/viral pneumonia spell and so she still little bit lethargic and weak.  No longer coughing and no longer febrile.  She is has not been walking like she used to.  She usually walks about 6 to 7 miles a week though.  She actually [place] today.  She only notes short of breath if she is trying to rush up a hill  or have to carry something up a hill.  She has not had any exertional chest pain or pressure.  She says that occasionally she stops breathing when she lies down flat, but only for a few seconds and then it comes back to normal.  Otherwise no PND orthopnea or edema.  No lightheadedness, dizziness, weakness or syncope/near syncope. No TIA/amaurosis fugax symptoms. No melena, hematochezia, hematuria, or epstaxis. No claudication.  ROS: A comprehensive was performed. Review of Systems  Constitutional: Negative for chills, fever, malaise/fatigue and weight loss.  Respiratory: Negative for cough (Just a little bit now), sputum production, shortness of breath and wheezing.        Just getting over a viral pneumonia.  Musculoskeletal: Positive for back pain (Probably from walking on). Negative for myalgias.  Neurological: Positive for tingling (Occasionally down her right leg when she is walking.).  Endo/Heme/Allergies: Positive for environmental allergies ("Hayfever ").  Psychiatric/Behavioral: Negative for memory loss. The patient is not nervous/anxious and does not have insomnia.   All other systems reviewed and are negative.  I have reviewed and (if needed) personally updated the patient's problem list, medications, allergies, past medical and surgical history, social and family history.   Past Medical History:  Diagnosis Date  . ADD (attention deficit disorder)   . Atrial tachycardia [facility])    s/p ablation by Dr Caryl Comes in 2005  . DDD (degenerative  disc disease), lumbar   . Diverticulosis   . Dyslipidemia    As of October 2019: LDL P 1564.  Goal less than 100.  Marland Kitchen GERD (gastroesophageal reflux disease)   . Internal hemorrhoids   . Social anxiety disorder   . SVT (supraventricular tachycardia) (HCC)    Status post ablation by Dr. Rayann Heman in 2015    Past Surgical History:  Procedure Laterality Date  . ABLATION OF DYSRHYTHMIC FOCUS  2005   atrial tachycardia ablated by Dr Caryl Comes  .  ABLATION OF DYSRHYTHMIC FOCUS  08/26/2013   atrial tachycardia ablated by Dr Rayann Heman, focus located along the tricuspid valve annulus  . ATRIAL FIBRILLATION ABLATION N/A 08/26/2013   Procedure: ATRIAL FIBRILLATION ABLATION;  Surgeon: Coralyn Mark, MD;  Location: [facility];  Service: Cardiovascular;  Laterality: N/A; ACTUALLY ATRIAL TACHYCARDIA  . c-section  1997, 1988   x2  . [place]  2002  . PARTIAL HYSTERECTOMY  2000  . TRANSTHORACIC ECHOCARDIOGRAM  07/2013   EF 55 to 60%.  GRII DD.  Mild AI, mild MR.  . TUBAL LIGATION      Current Meds  Medication Sig  . AMBULATORY NON FORMULARY MEDICATION EXLAX 100 mg 1 tablet       EXLAX 100 mg Take 1 tablet by mouth every other day  . aspirin 81 MG tablet Take 81 mg by mouth daily.  Marland Kitchen ezetimibe (ZETIA) 10 MG tablet Take 10 mg by mouth daily.  Marland Kitchen FLUoxetine (PROZAC) 10 MG capsule Take 1 capsule by mouth daily.  . hydrocortisone (ANUSOL-HC) 25 MG suppository Place 1 suppository (25 mg total) rectally 2 (two) times daily.  Marland Kitchen ibuprofen (ADVIL,MOTRIN) 600 MG tablet Take 1 tablet by mouth as needed.  Marland Kitchen LOVAZA 1 G capsule Take 1 g by mouth 2 (two) times daily.   . Multiple Vitamin (MULTIVITAMIN) tablet Take 1 tablet by mouth daily.    . niacin (NIASPAN) 750 MG CR tablet Take 750 mg by mouth at bedtime. With Zocor  . omeprazole (PRILOSEC) 40 MG capsule Take 40 mg by mouth daily.  Marland Kitchen REPATHA SURECLICK 416 MG/ML SOAJ INJECT 140 MG EVERY 2 WEEKS UTD  . simvastatin (ZOCOR) 40 MG tablet Take 40 mg by mouth at bedtime.    No Known Allergies  Social History   Tobacco Use  . Smoking status: Never Smoker  . Smokeless tobacco: Never Used  Substance Use Topics  . Alcohol use: Yes    Alcohol/week: 2.0 standard drinks    Types: 2 Cans of beer per week    Comment: occasional wine  . Drug use: No   Social History   Social History Narrative   Lives in [place] with spouse.     They have 2 children (Abby and Saralyn Pilar, & one  granddaughter named Festus Holts born in 2018.  Another granddaughter pending in February 2020).   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 she is now currently unemployed/retired.   She exercises 2 to 3 days a week for at least half an hour walking.  She also walks her dog just but every day several times a day.   Family History  family history includes CAD (age of onset: 13) in her maternal grandmother; CAD (age of onset: 44) in her mother; Coronary artery disease in her unknown relative; Dementia in her mother; Healthy in her brother and brother; Heart attack (age of onset: 68) in her maternal grandmother; Heart disease in her father and maternal aunt; Hypertension  in her father and mother; Lung cancer in her father; Melanoma in her sister; Peripheral vascular disease in her mother.  Wt Readings from Last 3 Encounters:  05/14/18 153 lb 6.4 oz (69.6 kg)  03/22/16 149 lb (67.6 kg)  03/16/16 151 lb 8 oz (68.7 kg)    PHYSICAL EXAM BP 110/75 (BP Location: Left Arm, Patient Position: Sitting, Cuff Size: Normal)   Pulse 82   Ht 5\' 4"  (1.626 m)   Wt 153 lb 6.4 oz (69.6 kg)   BMI 26.33 kg/m  Physical Exam  Constitutional: She is oriented to person, place, and time. She appears well-developed and well-nourished. No distress.  Healthy-appearing.  Well-groomed.  HENT:  Head: Normocephalic and atraumatic.  Mouth/Throat: Oropharynx is clear and moist.  Eyes: Pupils are equal, round, and reactive to light. Conjunctivae and EOM are normal. No scleral icterus.  Neck: Normal range of motion. Neck supple. No hepatojugular reflux and no JVD present. Carotid bruit is not present. No tracheal deviation present. No thyromegaly present.  Cardiovascular: Normal rate, regular rhythm, intact distal pulses and normal pulses.  No extrasystoles are present. PMI is not displaced. Exam reveals no gallop and no friction rub.  Murmur heard.  Harsh crescendo-decrescendo early systolic murmur is  present with a grade of 1/6 at the upper right sternal border and lower right sternal border. Pulmonary/Chest: Effort normal and breath sounds normal. No respiratory distress. She has no wheezes. She has no rales. She exhibits no tenderness.  Abdominal: Soft. Bowel sounds are normal. She exhibits no distension. There is no tenderness. There is no rebound.  Musculoskeletal: Normal range of motion. She exhibits no edema or deformity.  Neurological: She is alert and oriented to person, place, and time. No cranial nerve deficit.  Skin: Skin is warm and dry. No rash noted. No erythema. No pallor.  Psychiatric: She has a normal mood and affect. Her behavior is normal. Judgment and thought content normal.  Vitals reviewed.    Adult ECG Report  Rate: 82 ;  Rhythm: normal sinus rhythm and Normal axis, intervals and durations.;   Narrative Interpretation: Normal EKG   Other studies Reviewed: Additional studies/ records that were reviewed today include:  Recent Labs: From [facility] February 25, 2018: TC 179, TG 192, HDL 38, LDL 103.  (Was just placed back on simvastatin plus Zetia) A1c 4.8.  Creatinine 0.8.   ASSESSMENT / PLAN: Problem List Items Addressed This Visit    Agatston coronary artery calcium score between 200 and 399 - Primary    Relatively high risk findings with the calcification noted in the left main and LAD.  Given this location, I am fearful that a Myoview stress test would have a potential of having a false negative read. In the absence of symptoms, I do not think going straight heart catheterization is warranted.  Therefore we decided to coronary CT angiogram is the best next diagnostic approach for further delineation of the coronary artery disease.  This allows [place] anatomic and possibly physiologic evaluation.  She is on statin plus Zetia and I recommend that she stay on the aspirin.      Relevant Medications   REPATHA SURECLICK 748 MG/ML SOAJ   Other Relevant Orders   EKG 12-Lead  (Completed)   CT CORONARY MORPH W/CTA COR W/SCORE W/CA W/CM &/OR WO/CM   CT CORONARY FRACTIONAL FLOW RESERVE DATA PREP   CT CORONARY FRACTIONAL FLOW RESERVE FLUID ANALYSIS   Basic metabolic panel   Atrial tachycardia (HCC)  Status post ablation x2.  Now well controlled.  Not currently on beta-blocker.  With her current blood pressure of 110/75, I do not see any reason to actually treat unless she is having recurrent symptoms.  Certainly we can consider low-dose beta-blocker if she were to have a significant coronary disease noted on cardiac CT angiogram      Relevant Medications   REPATHA SURECLICK 371 MG/ML SOAJ   Other Relevant Orders   EKG 12-Lead (Completed)   CT CORONARY MORPH W/CTA COR W/SCORE W/CA W/CM &/OR WO/CM   CT CORONARY FRACTIONAL FLOW RESERVE DATA PREP   CT CORONARY FRACTIONAL FLOW RESERVE FLUID ANALYSIS   Basic metabolic panel   Dyspnea on exertion    Previously evaluated echocardiogram was relatively normal.  She is not really noticing that much on that she really exerts of hard.  At this time I do not think we need to recheck an echocardiogram as we are checking a Cardiac CT Angiogram.        Relevant Orders   EKG 12-Lead (Completed)   CT CORONARY MORPH W/CTA COR W/SCORE W/CA W/CM &/OR WO/CM   CT CORONARY FRACTIONAL FLOW RESERVE DATA PREP   CT CORONARY FRACTIONAL FLOW RESERVE FLUID ANALYSIS   Hyperlipidemia with target LDL less than 100 (Chronic)    At present, target is LDL less than 100 or LDL P less than thousand.  However if the coronary CT angiogram shows significant disease, I think we probably need to be more aggressive and treat more for LDL less than 70.  On simvastatin along with Zetia and niacin.  This is currently being followed by her PCP.      Relevant Medications   REPATHA SURECLICK 696 MG/ML SOAJ    Other Visit Diagnoses    Pre-op testing       Relevant Orders   Basic metabolic panel       I spent a total of 35 minutes with the patient  and chart review. >  50% of the time was spent in direct patient consultation.  After initial H&P questioning in the exam, the remainder of the time (greater than 3%) was spent discussing coronary calcification and its correlation with coronary artery disease.  We then discussed different follow-up studies with stress test versus coronary catheterization or coronary CT angiogram.  Questions were answered.  Current medicines are reviewed at length with the patient today.  (+/- concerns) none The following changes have been made:  None  Patient Instructions  Medication Instructions:  NOT NEEDED- SEE INSTRUCTION SHEET If you need a refill on your cardiac medications before your next appointment, please call your pharmacy.   Lab work: SEE INSTRUCTION SHEET If you have labs (blood work) drawn today and your tests are completely normal, you will receive your results only by: Marland Kitchen MyChart Message (if you have MyChart) OR . A paper copy in the mail If you have any lab test that is abnormal or we need to change your treatment, we will call you to review the results.  Testing/Procedures:  SCHEDULE AT [facility] physician has requested that you have cardiac( CORONARY) CTA. Cardiac computed tomography (CT) is a painless test that uses an x-ray machine to take clear, detailed pictures of your heart. For further information please visit HugeFiesta.tn. Please follow instruction sheet as given.     Follow-Up: At [facility], you and your health needs are our priority.  As part of our continuing mission to provide you with exceptional  heart care, we have created designated Provider Care Teams.  These Care Teams include your primary Cardiologist (physician) and Advanced Practice Providers (APPs -  Physician Assistants and Nurse Practitioners) who all work together to provide you with the care you need, when you need it. . Your physician recommends that you schedule a follow-up  appointment in 2 MONTHS WITH DR [facility] AFTER TEST  Any Other Special Instructions Will Be Listed Below (If Applicable).    INSTRUCTIONS FOR  CORONARY CTA    Please arrive at the [facility] main entrance of [facility] at (30-45 minutes prior to test start time)  [facility] [address] [place], [place] [address] [phone]  Proceed to the [facility] Radiology Department (First Floor).  Please follow these instructions carefully (unless otherwise directed):  PLEASE HAVE LABS - BMP  AT LEAST ONE WEEK PRIOR TO TEST  On the Night Before the Test: . Drink plenty of water. . Do not consume any caffeinated/decaffeinated beverages or chocolate 12 hours prior to your test. . Do not take any antihistamines 12 hours prior to your test.    On the Day of the Test: . Drink plenty of water. Do not drink any water within one hour of the test. . Do not eat any food 4 hours prior to the test. . You may take your regular medications prior to the test. .  Take 100 mg of lopressor (metoprolol) two hour before the test.   After the Test: . Drink plenty of water. . After receiving IV contrast, you may experience a mild flushed feeling. This is normal. . On occasion, you may experience a mild rash up to 24 hours after the test. This is not dangerous. If this occurs, you can take Benadryl 25 mg and increase your fluid intake. . If you experience trouble breathing, this can be serious. If it is severe call 911 IMMEDIATELY. If it is mild, please call our office.     .           Studies Ordered:   Orders Placed This Encounter  Procedures  . CT CORONARY MORPH W/CTA COR W/SCORE W/CA W/CM &/OR WO/CM  . CT CORONARY FRACTIONAL FLOW RESERVE DATA PREP  . CT CORONARY FRACTIONAL FLOW RESERVE FLUID ANALYSIS  . Basic metabolic panel  . EKG 12-Lead      Glenetta Hew, M.D., M.S. Interventional Cardiologist   Pager # [phone] Phone # [phone] [address] [place], [place] [address]   Thank you for choosing Heartcare at [facility]!!

## 2018-05-14 NOTE — Patient Instructions (Signed)
Medication Instructions:  NOT NEEDED- SEE INSTRUCTION SHEET If you need a refill on your cardiac medications before your next appointment, please call your pharmacy.   Lab work: SEE INSTRUCTION SHEET If you have labs (blood work) drawn today and your tests are completely normal, you will receive your results only by: Marland Kitchen MyChart Message (if you have MyChart) OR . A paper copy in the mail If you have any lab test that is abnormal or we need to change your treatment, we will call you to review the results.  Testing/Procedures:  SCHEDULE AT [facility] physician has requested that you have cardiac( CORONARY) CTA. Cardiac computed tomography (CT) is a painless test that uses an x-ray machine to take clear, detailed pictures of your heart. For further information please visit HugeFiesta.tn. Please follow instruction sheet as given.     Follow-Up: At [facility], you and your health needs are our priority.  As part of our continuing mission to provide you with exceptional heart care, we have created designated Provider Care Teams.  These Care Teams include your primary Cardiologist (physician) and Advanced Practice Providers (APPs -  Physician Assistants and Nurse Practitioners) who all work together to provide you with the care you need, when you need it. . Your physician recommends that you schedule a follow-up appointment in 2 MONTHS WITH DR [facility] AFTER TEST  Any Other Special Instructions Will Be Listed Below (If Applicable).    INSTRUCTIONS FOR  CORONARY CTA    Please arrive at the [facility] main entrance of [facility] at (30-45 minutes prior to test start time)  [facility] [place], [place] [address] [phone]  Proceed to the [facility] Radiology Department (First Floor).  Please follow these instructions carefully (unless otherwise directed):  PLEASE HAVE LABS - BMP  AT LEAST ONE WEEK PRIOR TO TEST  On  the Night Before the Test: . Drink plenty of water. . Do not consume any caffeinated/decaffeinated beverages or chocolate 12 hours prior to your test. . Do not take any antihistamines 12 hours prior to your test.    On the Day of the Test: . Drink plenty of water. Do not drink any water within one hour of the test. . Do not eat any food 4 hours prior to the test. . You may take your regular medications prior to the test. .  Take 100 mg of lopressor (metoprolol) two hour before the test.   After the Test: . Drink plenty of water. . After receiving IV contrast, you may experience a mild flushed feeling. This is normal. . On occasion, you may experience a mild rash up to 24 hours after the test. This is not dangerous. If this occurs, you can take Benadryl 25 mg and increase your fluid intake. . If you experience trouble breathing, this can be serious. If it is severe call 911 IMMEDIATELY. If it is mild, please call our office.     Marland Kitchen

## 2018-05-16 ENCOUNTER — Encounter: Payer: Self-pay | Admitting: Cardiology

## 2018-05-16 DIAGNOSIS — E785 Hyperlipidemia, unspecified: Secondary | ICD-10-CM | POA: Insufficient documentation

## 2018-05-16 NOTE — Assessment & Plan Note (Addendum)
At present, target is LDL less than 100 or LDL P less than thousand.  However if the coronary CT angiogram shows significant disease, I think we probably need to be more aggressive and treat more for LDL less than 70.  On simvastatin along with Zetia and niacin.  This is currently being followed by her PCP.

## 2018-05-16 NOTE — Assessment & Plan Note (Signed)
Relatively high risk findings with the calcification noted in the left main and LAD.  Given this location, I am fearful that a Myoview stress test would have a potential of having a false negative read. In the absence of symptoms, I do not think going straight heart catheterization is warranted.  Therefore we decided to coronary CT angiogram is the best next diagnostic approach for further delineation of the coronary artery disease.  This allows [place] anatomic and possibly physiologic evaluation.  She is on statin plus Zetia and I recommend that she stay on the aspirin.

## 2018-05-16 NOTE — Assessment & Plan Note (Signed)
Status post ablation x2.  Now well controlled.  Not currently on beta-blocker.  With her current blood pressure of 110/75, I do not see any reason to actually treat unless she is having recurrent symptoms.  Certainly we can consider low-dose beta-blocker if she were to have a significant coronary disease noted on cardiac CT angiogram

## 2018-05-16 NOTE — Assessment & Plan Note (Signed)
Previously evaluated echocardiogram was relatively normal.  She is not really noticing that much on that she really exerts of hard.  At this time I do not think we need to recheck an echocardiogram as we are checking a Cardiac CT Angiogram.

## 2018-06-30 DIAGNOSIS — I2510 Atherosclerotic heart disease of native coronary artery without angina pectoris: Secondary | ICD-10-CM

## 2018-06-30 HISTORY — DX: Atherosclerotic heart disease of native coronary artery without angina pectoris: I25.10

## 2018-07-09 ENCOUNTER — Ambulatory Visit (HOSPITAL_COMMUNITY)
Admission: RE | Admit: 2018-07-09 | Discharge: 2018-07-09 | Disposition: A | Discharge status: Home / Self Care | Payer: Managed Care, Other (non HMO) | Source: Ambulatory Visit | Attending: Cardiology | Admitting: Cardiology

## 2018-07-09 DIAGNOSIS — R0609 Other forms of dyspnea: Secondary | ICD-10-CM | POA: Insufficient documentation

## 2018-07-09 DIAGNOSIS — I471 Supraventricular tachycardia: Secondary | ICD-10-CM | POA: Diagnosis present

## 2018-07-09 DIAGNOSIS — R931 Abnormal findings on diagnostic imaging of heart and coronary circulation: Secondary | ICD-10-CM | POA: Diagnosis not present

## 2018-07-09 MED ORDER — NITROGLYCERIN 0.4 MG SL SUBL
0.4000 mg | SUBLINGUAL_TABLET | Freq: Once | SUBLINGUAL | Status: AC
  Administered 2018-07-09: 0.4 mg via SUBLINGUAL
  Filled 2018-07-09: qty 25

## 2018-07-09 MED ORDER — IOPAMIDOL (ISOVUE-370) INJECTION 76%
100.0000 mL | Freq: Once | INTRAVENOUS | Status: AC | PRN
  Administered 2018-07-09: 100 mL via INTRAVENOUS

## 2018-07-09 MED ORDER — NITROGLYCERIN 0.4 MG SL SUBL
SUBLINGUAL_TABLET | SUBLINGUAL | Status: AC
  Administered 2018-07-09: 0.4 mg via SUBLINGUAL
  Filled 2018-07-09: qty 1

## 2018-07-09 MED ORDER — NITROGLYCERIN 0.4 MG SL SUBL: 0.8000 mg | SUBLINGUAL_TABLET | Freq: Once | SUBLINGUAL | Status: DC

## 2018-07-15 ENCOUNTER — Encounter: Payer: Self-pay | Admitting: Cardiology

## 2018-07-15 ENCOUNTER — Ambulatory Visit (INDEPENDENT_AMBULATORY_CARE_PROVIDER_SITE_OTHER): Payer: Managed Care, Other (non HMO) | Admitting: Cardiology

## 2018-07-15 VITALS — BP 112/68 | HR 100 | Ht 64.0 in | Wt 149.6 lb

## 2018-07-15 DIAGNOSIS — R000 Tachycardia, unspecified: Secondary | ICD-10-CM | POA: Diagnosis not present

## 2018-07-15 DIAGNOSIS — E785 Hyperlipidemia, unspecified: Secondary | ICD-10-CM | POA: Diagnosis not present

## 2018-07-15 DIAGNOSIS — I2510 Atherosclerotic heart disease of native coronary artery without angina pectoris: Secondary | ICD-10-CM | POA: Diagnosis not present

## 2018-07-15 MED ORDER — METOPROLOL SUCCINATE ER 25 MG PO TB24: 25.0000 mg | ORAL_TABLET | Freq: Every day | ORAL | 3 refills | Status: DC

## 2018-07-15 MED ORDER — NITROGLYCERIN 0.4 MG SL SUBL: 0.4000 mg | SUBLINGUAL_TABLET | SUBLINGUAL | 3 refills | Status: DC | PRN

## 2018-07-15 NOTE — Patient Instructions (Signed)
Medication Instructions:  START metoprolol succinate (Toprol XL) 25 mg daily Start OTC CoQ10-increase as tolerated to 300 mg daily Take sublingual nitroglycerin AS NEEDED for chest pain  If you need a refill on your cardiac medications before your next appointment, please call your pharmacy.   Follow-Up: At [facility], you and your health needs are our priority.  As part of our continuing mission to provide you with exceptional heart care, we have created designated Provider Care Teams.  These Care Teams include your primary Cardiologist (physician) and Advanced Practice Providers (APPs -  Physician Assistants and Nurse Practitioners) who all work together to provide you with the care you need, when you need it. You will need a follow up appointment in 12 months.  Please call our office 2 months in advance to schedule this appointment.  You may see Glenetta Hew, MD or one of the following Advanced Practice Providers on your designated Care Team:   Rosaria Ferries, PA-C . Jory Sims, DNP, ANP

## 2018-07-15 NOTE — Progress Notes (Signed)
PCP: Crist Infante, MD  Clinic Note: Chief Complaint  Patient presents with  . Follow-up    Coronary CT angiogram    HPI: Cathy Bruce is a 57 y.o. female with a PMH below who presents today for 78-month follow-up evaluation of coronary CT angiogram for positive coronary calcium score. She has a history of low septal atrial tachycardia ablation by Dr. Rayann Heman back in March 2015.  Cathy Bruce was seen on May 14, 2018 for evaluation of abnormal coronary calcium score and family history of CAD.  Recent Hospitalizations: None  Studies Personally Reviewed - (if available, images/films reviewed: From Epic Chart or Care Everywhere)  Coronary CTA 07/10/2018: Heabily calcified pLAD - moderate stenosis (appears > 70%).  FFR 0.95, 0.82 distal (Not significant). Cx (p 0.98 - d 0.96). RCA (p 0.99, m 0.96, d 0.92)  Interval History: Cathy Bruce returns here today in good spirits.  She remains very active with routine exercise, walking etc.  She is just about getting over the walking pneumonia that she had when I last saw her.  Energy level finally start to get back to normal. She does have resting heart rate this pretty fast, but denies any significant palpitations or rapid irregular heartbeats. She has not had any chest tightness pressure with rest or exertion and denies any exertional dyspnea.  No PND, orthopnea or edema.  No lightheadedness, dizziness, weakness or syncope/near syncope. No TIA/amaurosis fugax symptoms. . No claudication.  ROS: A comprehensive was performed. Review of Systems  Constitutional: Negative for malaise/fatigue and weight loss.  HENT: Negative for congestion and nosebleeds.   Respiratory: Negative for cough, shortness of breath and wheezing.        Finally getting over URI  Gastrointestinal: Negative for abdominal pain, blood in stool, heartburn and melena.  Genitourinary: Negative for frequency and hematuria.  Musculoskeletal: Positive for joint pain.    Neurological: Positive for tingling (Mild right leg neuropathy/radiculopathy with walking.).  All other systems reviewed and are negative.    I have reviewed and (if needed) personally updated the patient's problem list, medications, allergies, past medical and surgical history, social and family history.   Past Medical History:  Diagnosis Date  . ADD (attention deficit disorder)   . Atrial tachycardia [facility])    s/p ablation by Dr Caryl Comes in 2005  . Coronary artery disease, non-occlusive 06/2018   High coronary calcium score: Coronary CTA shows heavily calcified proximal LAD lesion with moderate stenosis (FFR 0.95 across lesion, distal LAD FFR 0.82 -- not likely physiologic significant).  Mild disease elsewhere.  . DDD (degenerative disc disease), lumbar   . Diverticulosis   . Dyslipidemia    As of October 2019: LDL P 1564.  Goal less than 100.  Marland Kitchen GERD (gastroesophageal reflux disease)   . Internal hemorrhoids   . Social anxiety disorder   . SVT (supraventricular tachycardia) (HCC)    Status post ablation by Dr. Rayann Heman in 2015    Past Surgical History:  Procedure Laterality Date  . ABLATION OF DYSRHYTHMIC FOCUS  2005   atrial tachycardia ablated by Dr Caryl Comes  . ABLATION OF DYSRHYTHMIC FOCUS  08/26/2013   atrial tachycardia ablated by Dr Rayann Heman, focus located along the tricuspid valve annulus  . ATRIAL FIBRILLATION ABLATION N/A 08/26/2013   Procedure: ATRIAL FIBRILLATION ABLATION;  Surgeon: Coralyn Mark, MD;  Location: [facility];  Service: Cardiovascular;  Laterality: N/A; ACTUALLY ATRIAL TACHYCARDIA  . c-section  1997, 1988   x2  . HEMORRHOID SURGERY  2002  . PARTIAL HYSTERECTOMY  2000  . TRANSTHORACIC ECHOCARDIOGRAM  07/2013   EF 55 to 60%.  GRII DD.  Mild AI, mild MR.  . TUBAL LIGATION      Current Meds  Medication Sig  . AMBULATORY NON FORMULARY MEDICATION EXLAX 100 mg 1 tablet       EXLAX 100 mg Take 1 tablet by mouth every other day  . aspirin 81 MG tablet  Take 81 mg by mouth daily.  Marland Kitchen ezetimibe (ZETIA) 10 MG tablet Take 10 mg by mouth daily.  Marland Kitchen FLUoxetine (PROZAC) 10 MG capsule Take 1 capsule by mouth daily.  . hydrocortisone (ANUSOL-HC) 25 MG suppository Place 1 suppository (25 mg total) rectally 2 (two) times daily.  Marland Kitchen ibuprofen (ADVIL,MOTRIN) 600 MG tablet Take 1 tablet by mouth as needed.  Marland Kitchen LOVAZA 1 G capsule Take 1 g by mouth 2 (two) times daily.   . Multiple Vitamin (MULTIVITAMIN) tablet Take 1 tablet by mouth daily.    . niacin (NIASPAN) 750 MG CR tablet Take 750 mg by mouth at bedtime. With Zocor  . omeprazole (PRILOSEC) 40 MG capsule Take 40 mg by mouth daily.  Marland Kitchen REPATHA SURECLICK 161 MG/ML SOAJ INJECT 140 MG EVERY 2 WEEKS UTD  . simvastatin (ZOCOR) 40 MG tablet Take 40 mg by mouth at bedtime.  . traMADol (ULTRAM) 50 MG tablet Take 50 mg by mouth as needed.    No Known Allergies  Social History   Tobacco Use  . Smoking status: Never Smoker  . Smokeless tobacco: Never Used  Substance Use Topics  . Alcohol use: Yes    Alcohol/week: 2.0 standard drinks    Types: 2 Cans of beer per week    Comment: occasional wine  . Drug use: No   Social History   Social History Narrative   Lives in [place] with spouse.     They have 2 children (Abby and Saralyn Pilar, & one granddaughter named Festus Holts born in 2018.  Another granddaughter pending in February 2020).   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 she is now currently unemployed/retired.   She exercises 2 to 3 days a week for at least half an hour walking.  She also walks her dog just but every day several times a day.   Family History -notable for her mother having CAD having CABG as well as maternal grandmother with massive MI. family history includes CAD (age of onset: 70) in her maternal grandmother; CAD (age of onset: 13) in her mother; Coronary artery disease in an other family member; Dementia in her mother; Healthy in her brother and brother; Heart  attack (age of onset: 31) in her maternal grandmother; Heart disease in her father and maternal aunt; Hypertension in her father and mother; Lung cancer in her father; Melanoma in her sister; Peripheral vascular disease in her mother.  Wt Readings from Last 3 Encounters:  07/15/18 149 lb 9.6 oz (67.9 kg)  05/14/18 153 lb 6.4 oz (69.6 kg)  03/22/16 149 lb (67.6 kg)    PHYSICAL EXAM BP 112/68   Pulse 100   Ht 5\' 4"  (1.626 m)   Wt 149 lb 9.6 oz (67.9 kg)   SpO2 96%   BMI 25.68 kg/m  Physical Exam  Constitutional: She is oriented to person, place, and time. She appears well-developed and well-nourished. No distress.  Neck: No hepatojugular reflux and no JVD present. Carotid bruit is not present.  Cardiovascular: Normal rate, regular rhythm and intact  distal pulses. Exam reveals no gallop and no friction rub.  Murmur heard.  Low-pitched harsh crescendo-decrescendo early systolic murmur is present with a grade of 1/6 at the upper right sternal border radiating to the neck. No diastolic murmur Pulmonary/Chest: Effort normal and breath sounds normal. No respiratory distress. She has no wheezes. She has no rales.  Musculoskeletal: Normal range of motion.        General: No edema.  Neurological: She is alert and oriented to person, place, and time.  Psychiatric: She has a normal mood and affect. Her behavior is normal. Judgment and thought content normal.  Vitals reviewed.    Adult ECG Report Not checked  Other studies Reviewed: Additional studies/ records that were reviewed today include:  Recent Labs: Followed by PCP From [facility] February 25, 2018: TC 179, TG 192, HDL 38, LDL 103. (Was just placed back on simvastatin plus Zetia-in August) A1c 4.8. Creatinine 0.8.  No results found for: CHOL, HDL, LDLCALC, LDLDIRECT, TRIG, CHOLHDL  ASSESSMENT / PLAN: Problem List Items Addressed This Visit    Coronary artery disease, non-occlusive - Primary (Chronic)    Moderate LAD disease.  This is  certainly something that we need to pay attention to when be more aggressive managing.  She is now back on statin and Zetia.     Plan:   Recommend that she take co-Q10 to help alleviate side effects.  She is on aspirin which is recommended.  I will start Toprol 25 mg daily.  We will also give PRN nitroglycerin just in case.  Monitor for symptoms.      Relevant Medications   metoprolol succinate (TOPROL XL) 25 MG 24 hr tablet   nitroGLYCERIN (NITROSTAT) 0.4 MG SL tablet   Hyperlipidemia with target LDL less than 70 (Chronic)    With notable disease in the LAD, I think target LDL should be less than 70.  Is now on simvastatin and Zetia.  Labs followed by PCP. Recommend co-Q10 as well.      Relevant Medications   metoprolol succinate (TOPROL XL) 25 MG 24 hr tablet   nitroGLYCERIN (NITROSTAT) 0.4 MG SL tablet   Sinus tachycardia (Chronic)    No further atrial tachycardia or SVT, but does have sinus tachycardia. Plan: Start low-dose Toprol.  Monitor.         I spent a total of 41minutes with the patient and chart review. >  50% of the time was spent in direct patient consultation.   Current medicines are reviewed at length with the patient today.  (+/- concerns) none The following changes have been made:  Recommend co-Q10, will also start Toprol.  PRN nitro  Patient Instructions  Medication Instructions:  START metoprolol succinate (Toprol XL) 25 mg daily Start OTC CoQ10-increase as tolerated to 300 mg daily Take sublingual nitroglycerin AS NEEDED for chest pain  If you need a refill on your cardiac medications before your next appointment, please call your pharmacy.   Follow-Up: At [facility], you and your health needs are our priority.  As part of our continuing mission to provide you with exceptional heart care, we have created designated Provider Care Teams.  These Care Teams include your primary Cardiologist (physician) and Advanced Practice Providers (APPs -   Physician Assistants and Nurse Practitioners) who all work together to provide you with the care you need, when you need it. You will need a follow up appointment in 12 months.  Please call our office 2 months in advance to schedule  this appointment.  You may see Glenetta Hew, MD or one of the following Advanced Practice Providers on your designated Care Team:   Rosaria Ferries, PA-C . Jory Sims, DNP, ANP    Studies Ordered:   No orders of the defined types were placed in this encounter.     Glenetta Hew, M.D., M.S. Interventional Cardiologist   Pager # [phone] Phone # [phone] [address] [place], [place] [address]   Thank you for choosing Heartcare at [facility]!!

## 2018-07-17 ENCOUNTER — Encounter: Payer: Self-pay | Admitting: Cardiology

## 2018-07-17 DIAGNOSIS — R000 Tachycardia, unspecified: Secondary | ICD-10-CM | POA: Insufficient documentation

## 2018-07-17 NOTE — Assessment & Plan Note (Signed)
With notable disease in the LAD, I think target LDL should be less than 70.  Is now on simvastatin and Zetia.  Labs followed by PCP. Recommend co-Q10 as well.

## 2018-07-17 NOTE — Assessment & Plan Note (Signed)
No further atrial tachycardia or SVT, but does have sinus tachycardia. Plan: Start low-dose Toprol.  Monitor.

## 2018-07-17 NOTE — Assessment & Plan Note (Signed)
Moderate LAD disease.  This is certainly something that we need to pay attention to when be more aggressive managing.  She is now back on statin and Zetia.     Plan:   Recommend that she take co-Q10 to help alleviate side effects.  She is on aspirin which is recommended.  I will start Toprol 25 mg daily.  We will also give PRN nitroglycerin just in case.  Monitor for symptoms.

## 2019-07-27 ENCOUNTER — Other Ambulatory Visit: Payer: Self-pay | Admitting: Cardiology

## 2019-08-20 ENCOUNTER — Ambulatory Visit: Payer: Managed Care, Other (non HMO) | Admitting: Cardiology

## 2019-10-13 ENCOUNTER — Encounter: Payer: Self-pay | Admitting: Cardiology

## 2019-10-26 ENCOUNTER — Other Ambulatory Visit: Payer: Self-pay | Admitting: Cardiology

## 2020-01-05 ENCOUNTER — Other Ambulatory Visit: Payer: Self-pay | Admitting: Cardiology

## 2020-02-10 ENCOUNTER — Ambulatory Visit: Payer: Managed Care, Other (non HMO) | Admitting: Cardiology

## 2020-03-10 ENCOUNTER — Other Ambulatory Visit: Payer: Self-pay

## 2020-03-10 ENCOUNTER — Ambulatory Visit (INDEPENDENT_AMBULATORY_CARE_PROVIDER_SITE_OTHER): Payer: 59 | Admitting: Cardiology

## 2020-03-10 ENCOUNTER — Encounter: Payer: Self-pay | Admitting: Cardiology

## 2020-03-10 VITALS — BP 110/72 | HR 70 | Temp 97.2°F | Ht 64.0 in | Wt 151.0 lb

## 2020-03-10 DIAGNOSIS — E785 Hyperlipidemia, unspecified: Secondary | ICD-10-CM

## 2020-03-10 DIAGNOSIS — I471 Supraventricular tachycardia: Secondary | ICD-10-CM

## 2020-03-10 DIAGNOSIS — R931 Abnormal findings on diagnostic imaging of heart and coronary circulation: Secondary | ICD-10-CM

## 2020-03-10 DIAGNOSIS — I2510 Atherosclerotic heart disease of native coronary artery without angina pectoris: Secondary | ICD-10-CM | POA: Diagnosis not present

## 2020-03-10 NOTE — Patient Instructions (Signed)
Medication Instructions:  No changes *If you need a refill on your cardiac medications before your next appointment, please call your pharmacy*   Lab Work: Not needed   Testing/Procedures: Not needed   Follow-Up: At [facility], you and your health needs are our priority.  As part of our continuing mission to provide you with exceptional heart care, we have created designated Provider Care Teams.  These Care Teams include your primary Cardiologist (physician) and Advanced Practice Providers (APPs -  Physician Assistants and Nurse Practitioners) who all work together to provide you with the care you need, when you need it.  We recommend signing up for the patient portal called "MyChart".  Sign up information is provided on this After Visit Summary.  MyChart is used to connect with patients for Virtual Visits (Telemedicine).  Patients are able to view lab/test results, encounter notes, upcoming appointments, etc.  Non-urgent messages can be sent to your provider as well.   To learn more about what you can do with MyChart, go to [email or web address].    Your next appointment:   2 year(s)  The format for your next appointment:   In Person  Provider:   Glenetta Hew, MD   Other Instructions

## 2020-03-10 NOTE — Progress Notes (Signed)
**Note De-Identified Cathy Obfuscation** Primary Care Provider: Crist Infante, MD Cardiologist: Glenetta Hew, MD Electrophysiologist: None  Clinic Note: Chief Complaint  Patient presents with  . Follow-up    18-45-month; no complaints  . Coronary Artery Disease    Nonocclusive right coronary CTA   HPI:    Cathy Bruce is a 59 y.o. female with a PMH notable for history of ATRIAL TACHYCARDIA s/p ablation 2005, elevated CORONARY CALCIUM SCORE with nonobstructive CAD by CORONARY CTA in December 2019 along with hyperlipidemia who presents today for what amounts to be 37-month follow-up.  She was initially seen at the request of Crist Infante, MD.  Coronary CTA 07/10/2018: Heabily calcified pLAD - moderate stenosis (appears > 70%).  FFR 0.95, 0.82 distal (Not significant). Cx (p 0.98 - d 0.96). RCA (p 0.99, m 0.96, d 0.92)  Cathy Bruce was last seen on July 15, 2018.  She was in good spirits.  Trying to maintain good level of activity with exercise and walking.  Energy levels doing well.  No cardiac issues.  Recent Hospitalizations: None  Reviewed  CV studies:    The following studies were reviewed today: (if available, images/films reviewed: From Epic Chart or Care Everywhere) . None:   Interval History:   Cathy Bruce is here today for routine follow-up doing pretty well.  She has been pretty well with her diet and exercise.  She seems to be tolerating her Zetia plus simvastatin combination but is also been started on Praluent.  She has not had follow-up labs yet this year, per report has been having pretty good results.  She has not had any rapid palpitation sensations.  She does occasionally get short of breath if she rushes up a couple flights of steps or is carrying groceries up steps quickly.  Otherwise she really does not have short of breath if she is walking on flat ground.  CV Review of Symptoms (Summary) Cardiovascular ROS: positive for - dyspnea on exertion and Only with more vigorous  exertion negative for - chest pain, edema, irregular heartbeat, loss of consciousness, orthopnea, palpitations, paroxysmal nocturnal dyspnea, rapid heart rate, shortness of breath or Near syncope, lightheadedness or dizziness TIA/amaurosis fugax.  Claudication  The patient does not have symptoms concerning for COVID-19 infection (fever, chills, cough, or new shortness of breath).  The patient is practicing social distancing & Masking.   She has had the [employer] COVID-19 vaccine    REVIEWED OF SYSTEMS   Review of Systems  Constitutional: Negative for malaise/fatigue and weight loss.  HENT: Negative for congestion and nosebleeds.   Respiratory: Negative for shortness of breath.   Gastrointestinal: Negative for blood in stool and melena.  Genitourinary: Negative for hematuria.  Musculoskeletal: Positive for joint pain. Negative for falls.  Neurological: Negative for dizziness and weakness.  Psychiatric/Behavioral: Negative.    I have reviewed and (if needed) personally updated the patient's problem list, medications, allergies, past medical and surgical history, social and family history.   PAST MEDICAL HISTORY   Past Medical History:  Diagnosis Date  . ADD (attention deficit disorder)   . Atrial tachycardia [facility])    s/p ablation by Dr Caryl Comes in 2005  . Coronary artery disease, non-occlusive 06/2018   High coronary calcium score: Coronary CTA shows heavily calcified proximal LAD lesion with moderate stenosis (FFR 0.95 across lesion, distal LAD FFR 0.82 -- not likely physiologic significant).  Mild disease elsewhere.  . DDD (degenerative disc disease), lumbar   . Diverticulosis   . Dyslipidemia  As of October 2019: LDL P 1564.  Goal less than 100.  Marland Kitchen GERD (gastroesophageal reflux disease)   . Internal hemorrhoids   . Social anxiety disorder   . SVT (supraventricular tachycardia) ([place])    Status post ablation by Dr. Rayann Heman in 2015    PAST SURGICAL HISTORY   Past  Surgical History:  Procedure Laterality Date  . ABLATION OF DYSRHYTHMIC FOCUS  2005   atrial tachycardia ablated by Dr Caryl Comes  . ABLATION OF DYSRHYTHMIC FOCUS  08/26/2013   atrial tachycardia ablated by Dr Rayann Heman, focus located along the tricuspid valve annulus  . ATRIAL FIBRILLATION ABLATION N/A 08/26/2013   Procedure: ATRIAL FIBRILLATION ABLATION;  Surgeon: Coralyn Mark, MD;  Location: [facility];  Service: Cardiovascular;  Laterality: N/A; ACTUALLY ATRIAL TACHYCARDIA  . c-section  1997, 1988   x2  . [place]  2002  . PARTIAL HYSTERECTOMY  2000  . TRANSTHORACIC ECHOCARDIOGRAM  07/2013   EF 55 to 60%.  GRII DD.  Mild AI, mild MR.  . TUBAL LIGATION     . MEDICATIONS/ALLERGIES   Current Meds  Medication Sig  . albuterol (VENTOLIN HFA) 108 (90 Base) MCG/ACT inhaler SMARTSIG:2 Puff(s) By Mouth Twice Daily PRN  . Alirocumab (PRALUENT) 150 MG/ML SOAJ Praluent Pen 150 mg/mL subcutaneous pen injector  . aspirin 81 MG tablet Take 81 mg by mouth daily.  Marland Kitchen conjugated estrogens (PREMARIN) vaginal cream Premarin 0.625 mg/gram vaginal cream  Insert 0.5 applicatorsful every day by vaginal route.  . docusate sodium (COLACE) 250 MG capsule Take 250 mg by mouth daily. As needed  . ezetimibe (ZETIA) 10 MG tablet Take 10 mg by mouth daily.  Marland Kitchen FLUoxetine (PROZAC) 20 MG capsule Take 20 mg by mouth daily.  . hydrocortisone (ANUSOL-HC) 25 MG suppository Place 1 suppository (25 mg total) rectally 2 (two) times daily.  Marland Kitchen ibuprofen (ADVIL,MOTRIN) 600 MG tablet Take 1 tablet by mouth as needed.  . metoprolol succinate (TOPROL-XL) 25 MG 24 hr tablet TAKE 1 TABLET BY MOUTH EVERY DAY  . Multiple Vitamins-Minerals (MULTIVITAMIN ADULT EXTRA C PO) multivitamin  . niacin (NIASPAN) 750 MG CR tablet Take 750 mg by mouth at bedtime. With Zocor  . Omega-3 Fatty Acids (FISH OIL) 1000 MG CAPS Fish Oil  . omeprazole (PRILOSEC) 40 MG capsule Take 40 mg by mouth daily.  . simvastatin (ZOCOR) 40 MG tablet Take 40  mg by mouth at bedtime.  . traMADol (ULTRAM) 50 MG tablet Take 50 mg by mouth as needed.  . triamcinolone (KENALOG) 0.1 % paste triamcinolone acetonide 0.1 % dental paste    No Known Allergies  SOCIAL HISTORY/FAMILY HISTORY   Reviewed in Epic:  Pertinent findings: n/a  OBJCTIVE -PE, EKG, labs   Wt Readings from Last 3 Encounters:  03/10/20 151 lb (68.5 kg)  07/15/18 149 lb 9.6 oz (67.9 kg)  05/14/18 153 lb 6.4 oz (69.6 kg)    Physical Exam: BP 110/72   Pulse 70   Temp (!) 97.2 F (36.2 C)   Ht 5\' 4"  (1.626 m)   Wt 151 lb (68.5 kg)   SpO2 97%   BMI 25.92 kg/m  Physical Exam Vitals reviewed.  Constitutional:      General: She is not in acute distress.    Appearance: Normal appearance. She is normal weight. She is not ill-appearing.  HENT:     Head: Normocephalic and atraumatic.  Neck:     Vascular: No carotid bruit or JVD.  Cardiovascular:     Rate  and Rhythm: Normal rate and regular rhythm.  No extrasystoles are present.    Chest Wall: PMI is not displaced.     Pulses: Normal pulses.     Heart sounds: S1 normal and S2 normal. Murmur (Soft 1/6 SEM at RUSB.) heard.  No friction rub. No gallop.   Pulmonary:     Effort: Pulmonary effort is normal. No respiratory distress.     Breath sounds: Normal breath sounds.  Abdominal:     Palpations: Mass: No HSM.  Musculoskeletal:        General: No swelling. Normal range of motion.     Cervical back: Normal range of motion.  Neurological:     General: No focal deficit present.     Mental Status: She is alert and oriented to person, place, and time.  Psychiatric:        Mood and Affect: Mood normal.        Behavior: Behavior normal.        Thought Content: Thought content normal.        Judgment: Judgment normal.     Adult ECG Report  Rate: 70 ;  Rhythm: normal sinus rhythm and Left atrial abnormality, low voltage.  Otherwise normal durations.;   Narrative Interpretation: Borderline EKG.  Recent Labs:    No  results found for: CHOL, HDL, LDLCALC, LDLDIRECT, TRIG, CHOLHDL Lab Results  Component Value Date   CREATININE 0.7 08/19/2013   BUN 7 08/19/2013   NA 139 08/19/2013   K 3.8 08/26/2013   CL 107 08/19/2013   CO2 27 08/19/2013   No results found for: TSH  ASSESSMENT/PLAN    Problem List Items Addressed This Visit    Coronary artery disease, non-occlusive - Primary (Chronic)    Moderate LAD disease, calcified.  She is now on statin, Zetia and Praluent.  PCP following labs. Tolerating low-dose Toprol and aspirin. -Has not required as needed nitroglycerin.      Relevant Medications   Alirocumab (PRALUENT) 150 MG/ML SOAJ   Other Relevant Orders   EKG 12-Lead (Completed)   Atrial tachycardia (HCC) (Chronic)    History of ablation x2.  No breakthrough episodes. Continue beta-blocker.      Relevant Medications   Alirocumab (PRALUENT) 150 MG/ML SOAJ   Agatston coronary artery calcium score between 200 and 399 (Chronic)    High risk findings.  Goal LDL should be less than 70.  Now on Praluent.      Relevant Medications   Alirocumab (PRALUENT) 150 MG/ML SOAJ   Hyperlipidemia with target LDL less than 70 (Chronic)    Target LDL less than 70.  Is now on combination of simvastatin, Zetia and Praluent.  Due for follow-up labs soon.      Relevant Medications   Alirocumab (PRALUENT) 150 MG/ML SOAJ   Other Relevant Orders   EKG 12-Lead (Completed)       COVID-19 Education: The signs and symptoms of COVID-19 were discussed with the patient and how to seek care for testing (follow up with PCP or arrange E-visit).   The importance of social distancing and COVID-19 vaccination was discussed today.  I spent a total of 16 minutes with the patient. >  50% of the time was spent in direct patient consultation.  Additional time spent with chart review  / charting (studies, outside notes, etc): 6 Total Time: 22 min   Current medicines are reviewed at length with the patient today.   (+/- concerns) none  Notice: This dictation was prepared with  Dragon dictation along with smaller Company secretary. Any transcriptional errors that result from this process are unintentional and may not be corrected upon review.  Patient Instructions / Medication Changes & Studies & Tests Ordered   Patient Instructions  Medication Instructions:  No changes *If you need a refill on your cardiac medications before your next appointment, please call your pharmacy*   Lab Work: Not needed   Testing/Procedures: Not needed   Follow-Up: At [facility], you and your health needs are our priority.  As part of our continuing mission to provide you with exceptional heart care, we have created designated Provider Care Teams.  These Care Teams include your primary Cardiologist (physician) and Advanced Practice Providers (APPs -  Physician Assistants and Nurse Practitioners) who all work together to provide you with the care you need, when you need it.  We recommend signing up for the patient portal called "MyChart".  Sign up information is provided on this After Visit Summary.  MyChart is used to connect with patients for Virtual Visits (Telemedicine).  Patients are able to view lab/test results, encounter notes, upcoming appointments, etc.  Non-urgent messages can be sent to your provider as well.   To learn more about what you can do with MyChart, go to [email or web address].    Your next appointment:   2 year(s)  The format for your next appointment:   In Person  Provider:   Glenetta Hew, MD   Other Instructions    Studies Ordered:   Orders Placed This Encounter  Procedures  . EKG 12-Lead     Glenetta Hew, M.D., M.S. Interventional Cardiologist   Pager # [phone] Phone # [phone] [address] [place], [place] [address]   Thank you for choosing [facility] at [facility]!!

## 2020-03-14 ENCOUNTER — Encounter: Payer: Self-pay | Admitting: Cardiology

## 2020-03-14 NOTE — Assessment & Plan Note (Signed)
High risk findings.  Goal LDL should be less than 70.  Now on Praluent.

## 2020-03-14 NOTE — Assessment & Plan Note (Signed)
Moderate LAD disease, calcified.  She is now on statin, Zetia and Praluent.  PCP following labs. Tolerating low-dose Toprol and aspirin. -Has not required as needed nitroglycerin.

## 2020-03-14 NOTE — Assessment & Plan Note (Signed)
History of ablation x2.  No breakthrough episodes. Continue beta-blocker.

## 2020-03-14 NOTE — Assessment & Plan Note (Signed)
Target LDL less than 70.  Is now on combination of simvastatin, Zetia and Praluent.  Due for follow-up labs soon.

## 2020-06-14 ENCOUNTER — Other Ambulatory Visit: Payer: Self-pay | Admitting: Cardiology

## 2020-06-15 ENCOUNTER — Encounter: Payer: Self-pay | Admitting: Neurology

## 2020-06-15 ENCOUNTER — Ambulatory Visit (INDEPENDENT_AMBULATORY_CARE_PROVIDER_SITE_OTHER): Payer: 59 | Admitting: Neurology

## 2020-06-15 VITALS — BP 129/79 | HR 93 | Ht 64.0 in | Wt 152.0 lb

## 2020-06-15 DIAGNOSIS — I471 Supraventricular tachycardia, unspecified: Secondary | ICD-10-CM

## 2020-06-15 DIAGNOSIS — R0683 Snoring: Secondary | ICD-10-CM

## 2020-06-15 DIAGNOSIS — G4752 REM sleep behavior disorder: Secondary | ICD-10-CM | POA: Diagnosis not present

## 2020-06-15 DIAGNOSIS — G4709 Other insomnia: Secondary | ICD-10-CM

## 2020-06-15 DIAGNOSIS — I2510 Atherosclerotic heart disease of native coronary artery without angina pectoris: Secondary | ICD-10-CM

## 2020-06-15 DIAGNOSIS — G4739 Other sleep apnea: Secondary | ICD-10-CM

## 2020-06-15 DIAGNOSIS — I4719 Other supraventricular tachycardia: Secondary | ICD-10-CM

## 2020-06-15 NOTE — Progress Notes (Addendum)
SLEEP MEDICINE CLINIC    Provider:  Larey Seat, MD  Primary Care Physician:  Crist Infante, MD [place] [address]     Referring Provider: Crist Infante, [place],   [id number]          Chief Complaint according to patient   Patient presents with:    . New Patient (Initial Visit)     pt alone, rm 10. presents today stating that she has never had a PSG/ HST .she reports  that when she is sleeping on her back she finds it very hard to breathe. she snores in sleep and avg 8 hrs of sleep with exception of waking up 1-2 times a night to void.       HISTORY OF PRESENT ILLNESS:  Cathy Bruce is a 59  year old Caucasian female patient and seen on 06/15/2020 upon CONSULTATION requested by dr. Joylene Draft.  Chief concern  :  When sleeping on her back she has struggled to breathe, felt palpitations, and her mouth trembling. Known history of bruxism, and uses a mouth guard.  = Cathy Bruce presents with a possible sleep disorder. She  has a  medical history of ADD (attention deficit disorder), Atrial tachycardia ([place]), Coronary artery disease, non-occlusive (06/2018), DDD (degenerative disc disease), lumbar, Diverticulosis, Dyslipidemia, GERD (gastroesophageal reflux disease), Internal hemorrhoids, Social anxiety disorder, and SVT (supraventricular tachycardia) ([place]) , had 2 ablations with Dr Rayann Heman.    Sleep relevant medical history: Nocturia 2-4 times, dream enactment, new- nasal rhinitis, bruxism and sinusitis.    Family medical /sleep history: mother  with OSA,  mother has dementia,  Social history:  Patient is retired and takes care of her grandchildren( 14 of them)  and lives in a household with spouse, no pets, mother moved to assisted living. 2 adult children. Tobacco use- none.  ETOH use ; rarely - less than 2 /month. Caffeine intake in form of Soda( 3/ day)  and no energy drinks. Regular exercise in form of walking.      Sleep habits  are as follows: The patient's dinner time is between 6-6.30 PM. The patient goes to bed at 12 PM- 1 AM,  And sometimes has trouble to fall asleep- once asleep she  continues to sleep for intervals of 2-6 hours , wakes for bathroom breaks.   The preferred sleep position is on her side , with the support of 4 pillows. Bed is not adjusted. Back pain- pillows between her legs.  Dreams are reportedly frequent/vivid- sometimes acting out, yelling .  9-11 AM is the usual rise time. The patient wakes up spontaneously - 'not a morning person". .  She reports most mornings  feeling refreshed and  restored in AM, with symptoms such as dry mouth- and eyes, and residual fatigue.Naps are taken frequently, lasting from 45-65 minutes on her couch, on her back- "breathing cutting off'  -these  are less refreshing than nocturnal sleep.    Review of Systems: Out of a complete 14 system review, the patient complains of only the following symptoms, and all other reviewed systems are negative.:  Fatigue, sleepiness , snoring, nocturia.  fragmented sleep, Insomnia some nights.    How likely are you to doze in the following situations: 0 = not likely, 1 = slight chance, 2 = moderate chance, 3 = high chance   Sitting and Reading? Watching Television? Sitting inactive in a public place (theater or meeting)? As a passenger in a  car for an hour without a break? Lying down in the afternoon when circumstances permit? Sitting and talking to someone? Sitting quietly after lunch without alcohol? In a car, while stopped for a few minutes in traffic?   Total = 8/ 24 points  With daily naps.   FSS endorsed at 28/ 63 points.   Social History   Socioeconomic History  . Marital status: Married    Spouse name: Not on file  . Number of children: 2  . Years of education: Not on file  . Highest education level: Bachelor's degree (e.g., BA, AB, BS)  Occupational History  . Occupation: Government social research officer    Comment: Retired:  [employer]  Tobacco Use  . Smoking status: Never Smoker  . Smokeless tobacco: Never Used  Substance and Sexual Activity  . Alcohol use: Yes    Alcohol/week: 2.0 standard drinks    Types: 2 Cans of beer per week    Comment: occasional wine  . Drug use: No  . Sexual activity: Not on file  Other Topics Concern  . Not on file  Social History Narrative   Lives in [place] with spouse.     They have 2 children (Abby and Saralyn Pilar, & one granddaughter named Festus Holts born in 2018.  Another granddaughter pending in February 2020).   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 she is now currently unemployed/retired.   She exercises 2 to 3 days a week for at least half an hour walking.  She also walks her dog just but every day several times a day.   Social Determinants of Health   Financial Resource Strain:   . Difficulty of Paying Living Expenses: Not on file  Food Insecurity:   . Worried About Charity fundraiser in the Last Year: Not on file  . Ran Out of Food in the Last Year: Not on file  Transportation Needs:   . Lack of Transportation (Medical): Not on file  . Lack of Transportation (Non-Medical): Not on file  Physical Activity:   . Days of Exercise per Week: Not on file  . Minutes of Exercise per Session: Not on file  Stress:   . Feeling of Stress : Not on file  Social Connections:   . Frequency of Communication with Friends and Family: Not on file  . Frequency of Social Gatherings with Friends and Family: Not on file  . Attends Religious Services: Not on file  . Active Member of Clubs or Organizations: Not on file  . Attends Archivist Meetings: Not on file  . Marital Status: Not on file    Family History  Problem Relation Age of Onset  . Heart disease Father   . Lung cancer Father   . Hypertension Father   . Coronary artery disease Other   . CAD Mother 57       CABG  . Peripheral vascular disease Mother   . Dementia  Mother   . Hypertension Mother   . Melanoma Sister   . Heart attack Maternal Grandmother 50  . CAD Maternal Grandmother 59       CABG at 44  . Heart disease Maternal Aunt   . Healthy Brother   . Healthy Brother   . Colon cancer Neg Hx     Past Medical History:  Diagnosis Date  . ADD (attention deficit disorder)   . Atrial tachycardia [facility])    s/p ablation by Dr Caryl Comes in 2005  .  Coronary artery disease, non-occlusive 06/2018   High coronary calcium score: Coronary CTA shows heavily calcified proximal LAD lesion with moderate stenosis (FFR 0.95 across lesion, distal LAD FFR 0.82 -- not likely physiologic significant).  Mild disease elsewhere.  . DDD (degenerative disc disease), lumbar   . Diverticulosis   . Dyslipidemia    As of October 2019: LDL P 1564.  Goal less than 100.  Marland Kitchen GERD (gastroesophageal reflux disease)   . Internal hemorrhoids   . Social anxiety disorder   . SVT (supraventricular tachycardia) ([facility])    Status post ablation by Dr. Rayann Heman in 2015    Past Surgical History:  Procedure Laterality Date  . ABLATION OF DYSRHYTHMIC FOCUS  2005   atrial tachycardia ablated by Dr Caryl Comes  . ABLATION OF DYSRHYTHMIC FOCUS  08/26/2013   atrial tachycardia ablated by Dr Rayann Heman, focus located along the tricuspid valve annulus  . ATRIAL FIBRILLATION ABLATION N/A 08/26/2013   Procedure: ATRIAL FIBRILLATION ABLATION;  Surgeon: Coralyn Mark, MD;  Location[facility];  Service: Cardiovascular;  Laterality: N/A; ACTUALLY ATRIAL TACHYCARDIA  . c-section  1997, 1988   x2  . [place]  2002  . PARTIAL HYSTERECTOMY  2000  . TRANSTHORACIC ECHOCARDIOGRAM  07/2013   EF 55 to 60%.  GRII DD.  Mild AI, mild MR.  . TUBAL LIGATION       Current Outpatient Medications on File Prior to Visit  Medication Sig Dispense Refill  . albuterol (VENTOLIN HFA) 108 (90 Base) MCG/ACT inhaler SMARTSIG:2 Puff(s) By Mouth Twice Daily PRN    . Alirocumab (PRALUENT) 150 MG/ML SOAJ Praluent Pen 150  mg/mL subcutaneous pen injector    . aspirin 81 MG tablet Take 81 mg by mouth daily.    Marland Kitchen conjugated estrogens (PREMARIN) vaginal cream Premarin 0.625 mg/gram vaginal cream  Insert 0.5 applicatorsful every day by vaginal route.    . docusate sodium (COLACE) 250 MG capsule Take 250 mg by mouth daily. As needed    . ezetimibe (ZETIA) 10 MG tablet Take 10 mg by mouth daily.    Marland Kitchen FLUoxetine (PROZAC) 20 MG capsule Take 20 mg by mouth daily.    . hydrocortisone (ANUSOL-HC) 25 MG suppository Place 1 suppository (25 mg total) rectally 2 (two) times daily. 10 suppository 0  . ibuprofen (ADVIL,MOTRIN) 600 MG tablet Take 1 tablet by mouth as needed.  0  . metoprolol succinate (TOPROL-XL) 25 MG 24 hr tablet TAKE 1 TABLET BY MOUTH EVERY DAY 90 tablet 3  . Multiple Vitamins-Minerals (MULTIVITAMIN ADULT EXTRA C PO) multivitamin    . niacin (NIASPAN) 750 MG CR tablet Take 750 mg by mouth at bedtime. With Zocor    . Omega-3 Fatty Acids (FISH OIL) 1000 MG CAPS Fish Oil    . omeprazole (PRILOSEC) 40 MG capsule Take 40 mg by mouth daily.    . simvastatin (ZOCOR) 40 MG tablet Take 40 mg by mouth at bedtime.  3  . traMADol (ULTRAM) 50 MG tablet Take 50 mg by mouth as needed.  1  . triamcinolone (KENALOG) 0.1 % paste triamcinolone acetonide 0.1 % dental paste    . nitroGLYCERIN (NITROSTAT) 0.4 MG SL tablet Place 1 tablet (0.4 mg total) under the tongue every 5 (five) minutes as needed for chest pain. 25 tablet 3   No current facility-administered medications on file prior to visit.    No Known Allergies  Physical exam:  Today's Vitals   06/15/20 1523  BP: 129/79  Pulse: 93  Weight: 152  lb (68.9 kg)  Height: 5\' 4"  (1.626 m)   Body mass index is 26.09 kg/m.   Wt Readings from Last 3 Encounters:  06/15/20 152 lb (68.9 kg)  03/10/20 151 lb (68.5 kg)  07/15/18 149 lb 9.6 oz (67.9 kg)     Ht Readings from Last 3 Encounters:  06/15/20 5\' 4"  (1.626 m)  03/10/20 5\' 4"  (1.626 m)  07/15/18 5\' 4"  (1.626 m)       General: The patient is awake, alert and appears not in acute distress. The patient is well groomed. Head: Normocephalic, atraumatic. Neck is supple. Mallampati  3,  neck circumference:14 inches . Nasal airflow barely patent.  Retrognathia is  seen.  Dental status: intact.  Cardiovascular:  Regular rate and cardiac rhythm by pulse,  without distended neck veins. Respiratory: Lungs are clear to auscultation.  Skin:  Without evidence of ankle edema, or rash. Trunk: The patient's posture is erect.   Neurologic exam : The patient is awake and alert, oriented to place and time.   Memory subjective described as intact.  Attention span & concentration ability appears normal.  Speech is fluent,  without  dysarthria, dysphonia or aphasia.  Mood and affect are appropriate.   Cranial nerves: no loss of smell or taste reported  Pupils are equal and briskly reactive to light. Funduscopic exam deferred. .  Extraocular movements in vertical and horizontal planes were intact and without nystagmus. No Diplopia. Visual fields by finger perimetry are intact. Hearing was intact to soft voice and finger rubbing.    Facial sensation intact to fine touch.  Facial motor strength is symmetric and tongue and uvula move midline.   She has noted tremors in the midface. This is going on in sleep.  Neck ROM : rotation, tilt and flexion extension were normal for age and shoulder shrug was symmetrical.    Motor exam:  Symmetric bulk, tone and ROM.   Normal tone without cog- wheeling, symmetric grip strength .   Sensory:  Fine touch, pinprick and vibration were tested  and  normal.  Proprioception tested in the upper extremities was normal.   Coordination: Rapid alternating movements in the fingers/hands were of normal speed.  The Finger-to-nose maneuver was intact without evidence of ataxia, dysmetria or tremor. No change in penmanship.    Gait and station: Patient could rise unassisted from a seated  position, walked without assistive device.  Stance is of normal width/ base..  Toe and heel walk were deferred.  Deep tendon reflexes: in the  upper and lower extremities are symmetric and intact.  Babinski response was deferred .      After spending a total time of  45  minutes face to face and additional time for physical and neurologic examination, review of laboratory studies,  personal review of imaging studies, reports and results of other testing and review of referral information / records as far as provided in visit, I have established the following assessments:  1) Patient with mild retrognathia, small oral opening and narrow airway presenting with occasional difficulties to initiate sleep.  2)  Tachycardia and " trembling " in supine sleep, when sleeping on the couch. facial trembling only in sleep, not while awake and in all sleep positions. 3) Recently noted dream enactment.  It may help to eliminate TV from naps.    My Plan is to proceed with:  1) I would like to invite Mrs. Emile who will celebrate her 59th birthday in 3 days for an attended  sleep study to evaluate her for REM sleep behavior or dream enactment.  I think there are several concerns here #1 she has noted a trembling in her face independent of sleep position #2 she has developed tachycardia which she had in the past but also has woken her from sleep, she has also noted frequent urination between 2 and 4 times interrupting her sleep.  Sometimes she has trouble initiating sleep but she does not have difficulties to sustain or maintain sleep.  Given her small upper airway and mild retrognathia I would like to also screen for sleep apnea at the same time. She is on TRAMADOL for back pain,and could also develop central sleep apnea.  Attended Sleep Study, with expanded EEG montage.  If not permitted by insurance, consider HST.    I would like to thank  Crist Infante, [address] [place],  [place] [address] for  allowing me to meet with and to take care of this pleasant patient.    I plan to follow up either personally or through our NP within 3 month.     Electronically signed by: Larey Seat, MD 06/15/2020 3:35 PM  [facility] and [facility] Board certified by The [employer] of Sleep Medicine and Diplomate of the [employer] of Sleep Medicine. Board certified In Neurology through the [place], Fellow of the [employer] of Neurology. Medical Director of [employer].

## 2020-06-15 NOTE — Patient Instructions (Signed)

## 2020-07-14 ENCOUNTER — Telehealth: Payer: Self-pay

## 2020-07-14 NOTE — Telephone Encounter (Signed)
LVM for pt to call me back to schedule sleep study  

## 2020-07-19 ENCOUNTER — Telehealth: Payer: Self-pay

## 2020-07-19 NOTE — Telephone Encounter (Signed)
LVM for pt to call me back to schedule sleep study  

## 2020-07-27 ENCOUNTER — Telehealth: Payer: Self-pay

## 2020-07-27 NOTE — Telephone Encounter (Signed)
We have attempted to call the patient two times to schedule sleep study.  Patient has been unavailable at the phone numbers we have on file and has not returned our calls. If patient calls back we will schedule them for their sleep study.  

## 2020-08-27 ENCOUNTER — Other Ambulatory Visit: Payer: Self-pay | Admitting: Neurosurgery

## 2020-09-08 ENCOUNTER — Other Ambulatory Visit: Payer: Self-pay | Admitting: Neurosurgery

## 2020-09-20 ENCOUNTER — Ambulatory Visit (INDEPENDENT_AMBULATORY_CARE_PROVIDER_SITE_OTHER): Payer: 59 | Admitting: Neurology

## 2020-09-20 DIAGNOSIS — G4733 Obstructive sleep apnea (adult) (pediatric): Secondary | ICD-10-CM | POA: Diagnosis not present

## 2020-09-20 DIAGNOSIS — I2510 Atherosclerotic heart disease of native coronary artery without angina pectoris: Secondary | ICD-10-CM

## 2020-09-20 DIAGNOSIS — G4752 REM sleep behavior disorder: Secondary | ICD-10-CM

## 2020-09-20 DIAGNOSIS — I471 Supraventricular tachycardia: Secondary | ICD-10-CM

## 2020-09-20 DIAGNOSIS — G4739 Other sleep apnea: Secondary | ICD-10-CM

## 2020-09-20 DIAGNOSIS — G4709 Other insomnia: Secondary | ICD-10-CM

## 2020-09-20 DIAGNOSIS — R0683 Snoring: Secondary | ICD-10-CM

## 2020-09-21 NOTE — Progress Notes (Signed)
Your procedure is scheduled on Monday Sep 27, 2020.  Report to [facility] Main Entrance "A" at 05:30 A.M., and check in at the Admitting office.  Call this number if you have problems the morning of surgery: [phone]  Call [phone] if you have any questions prior to your surgery date Monday-Friday 8am-4pm   Remember: Do not eat or drink after midnight the night before your surgery  Take these medicines the morning of surgery with A SIP OF WATER: metoprolol succinate (TOPROL-XL) omeprazole (PRILOSEC)  If needed: albuterol (VENTOLIN HFA) ---- Please bring all inhalers with you the day of surgery.  nitroGLYCERIN (NITROSTAT) traMADol (ULTRAM)    Follow your surgeon's instructions on when to stop Aspirin.  If no instructions were given by your surgeon then you will need to call the office to get those instructions.    As of today, STOP taking any Aleve, Naproxen, Ibuprofen, Motrin, Advil, Goody's, BC's, all herbal medications, fish oil, and all vitamins.    The Morning of Surgery  Do not wear jewelry, make-up or nail polish.  Do not wear lotions, powders, or perfumes, or deodorant  Do not shave 48 hours prior to surgery.    Do not bring valuables to the hospital.  [facility] is not responsible for any belongings or valuables.  If you are a smoker, DO NOT Smoke 24 hours prior to surgery  If you wear a CPAP at night please bring your mask the morning of surgery   Remember that you must have someone to transport you home after your surgery, and remain with you for 24 hours if you are discharged the same day.   Please bring cases for contacts, glasses, hearing aids, dentures or bridgework because it cannot be worn into surgery.    Leave your suitcase in the car.  After surgery it may be brought to your room.  For patients admitted to the hospital, discharge time will be determined by your treatment team.  Patients discharged the day of surgery will not be allowed to  drive home.    Special instructions:   Cathy Bruce- Preparing For Surgery  Before surgery, you can play an important role. Because skin is not sterile, your skin needs to be as free of germs as possible. You can reduce the number of germs on your skin by washing with CHG (chlorahexidine gluconate) Soap before surgery.  CHG is an antiseptic cleaner which kills germs and bonds with the skin to continue killing germs even after washing.    Oral Hygiene is also important to reduce your risk of infection.  Remember - BRUSH YOUR TEETH THE MORNING OF SURGERY WITH YOUR REGULAR TOOTHPASTE  Please do not use if you have an allergy to CHG or antibacterial soaps. If your skin becomes reddened/irritated stop using the CHG.  Do not shave (including legs and underarms) for at least 48 hours prior to first CHG shower. It is OK to shave your face.  Please follow these instructions carefully.   1. Shower the NIGHT BEFORE SURGERY and the MORNING OF SURGERY with CHG Soap.   2. If you chose to wash your hair and body, wash as usual with your normal shampoo and body-wash/soap.  3. Rinse your hair and body thoroughly to remove the shampoo and soap.  4. Apply CHG directly to the skin (ONLY FROM THE NECK DOWN) and wash gently with a scrungie or a clean washcloth.   5. Do not use on open wounds or open sores. Avoid  contact with your eyes, ears, mouth and genitals (private parts). Wash Face and genitals (private parts)  with your normal soap.   6. Wash thoroughly, paying special attention to the area where your surgery will be performed.  7. Thoroughly rinse your body with warm water from the neck down.  8. DO NOT shower/wash with your normal soap after using and rinsing off the CHG Soap.  9. Pat yourself dry with a CLEAN TOWEL.  10. Wear CLEAN PAJAMAS to bed the night before surgery  11. Place CLEAN SHEETS on your bed the night of your first shower and DO NOT SLEEP WITH PETS.  12. Wear comfortable clothes  the morning of surgery.     Day of Surgery:  Please shower the morning of surgery with the CHG soap Do not apply any deodorants/lotions. Please wear clean clothes to the hospital/surgery center.   Remember to brush your teeth WITH YOUR REGULAR TOOTHPASTE.   Please read over the following fact sheets that you were given.

## 2020-09-22 ENCOUNTER — Encounter (HOSPITAL_COMMUNITY)
Admission: RE | Admit: 2020-09-22 | Discharge: 2020-09-22 | Disposition: A | Discharge status: Home / Self Care | Payer: 59 | Source: Ambulatory Visit | Attending: Neurosurgery | Admitting: Neurosurgery

## 2020-09-22 ENCOUNTER — Encounter (HOSPITAL_COMMUNITY): Payer: Self-pay

## 2020-09-22 ENCOUNTER — Other Ambulatory Visit: Payer: Self-pay

## 2020-09-22 DIAGNOSIS — Z01812 Encounter for preprocedural laboratory examination: Secondary | ICD-10-CM | POA: Insufficient documentation

## 2020-09-22 LAB — BASIC METABOLIC PANEL
Anion gap: 11 (ref 5–15)
BUN: 5 mg/dL — ABNORMAL LOW (ref 6–20)
CO2: 23 mmol/L (ref 22–32)
Calcium: 8.7 mg/dL — ABNORMAL LOW (ref 8.9–10.3)
Chloride: 105 mmol/L (ref 98–111)
Creatinine, Ser: 0.76 mg/dL (ref 0.44–1.00)
GFR, Estimated: >60 mL/min (ref >60)
Glucose, Bld: 146 mg/dL — ABNORMAL HIGH (ref 70–99)
Potassium: 3.3 mmol/L — ABNORMAL LOW (ref 3.5–5.1)
Sodium: 139 mmol/L (ref 135–145)

## 2020-09-22 LAB — SURGICAL PCR SCREEN
MRSA, PCR: NEGATIVE
Staphylococcus aureus: NEGATIVE

## 2020-09-22 LAB — CBC
HCT: 38.7 % (ref 36.0–46.0)
Hemoglobin: 13.5 g/dL (ref 12.0–15.0)
MCH: 29.9 pg (ref 26.0–34.0)
MCHC: 34.9 g/dL (ref 30.0–36.0)
MCV: 85.6 fL (ref 80.0–100.0)
Platelets: 294 10*3/uL (ref 150–400)
RBC: 4.52 MIL/uL (ref 3.87–5.11)
RDW: 12.9 % (ref 11.5–15.5)
WBC: 7.2 10*3/uL (ref 4.0–10.5)
nRBC: 0 % (ref 0.0–0.2)

## 2020-09-22 LAB — TYPE AND SCREEN
ABO/RH(D): A POS
Antibody Screen: NEGATIVE

## 2020-09-22 NOTE — Progress Notes (Signed)
PCP Sherrye Payor, MD Cardiologist - Ellyn Hack, MD  Chest x-ray -  EKG - 03/10/20 Stress Test - denies ECHO - 08/07/13 Cardiac Cath - denies  Sleep Study - yes - awaiting results CPAP - no   Aspirin Instructions: per pt last dose ASA 09/16/20   COVID TEST- scheduled 09/24/20   Anesthesia review: n/a  Patient denies shortness of breath, fever, cough and chest pain at PAT appointment   All instructions explained to the patient, with a verbal understanding of the material. Patient agrees to go over the instructions while at home for a better understanding. Patient also instructed to self quarantine after being tested for COVID-19. The opportunity to ask questions was provided.

## 2020-09-23 NOTE — Progress Notes (Signed)
   [facility] [place] TEST (Watch PAT)  STUDY DATA AVAILABLE: 09/23/20  DOB: May 30, 1961  MRN: [id number]  ORDERING CLINICIAN: Larey Seat, MD   REFERRING CLINICIAN: Crist Infante, MD   CLINICAL INFORMATION/HISTORY: Cathy Bruce a 60  year old Caucasian female patientand was seenon 06/15/2020 for a sleep medicine consultation requested by Dr. Crist Infante.  Chiefconcern :  When sleeping on her back she has struggled to breathe, has felt palpitations and her mouth trembling. With a known history of bruxism, she has been using a mouth guard.  ANGLA DELAHUNT  has a  medical history of ADD (attention deficit disorder), Atrial tachycardia ([place]), Coronary artery disease, non-occlusive (06/2018), DDD (degenerative disc disease), lumbar, Diverticulosis, Dyslipidemia, GERD (gastroesophageal reflux disease), Internal hemorrhoids, Social anxiety disorder, and SVT (supraventricular tachycardia) ([place]) , and had 2 cardiac ablations with Dr Rayann Heman. Her concern is that of REM BD and dream enactment, tremors and nocturia. She has a small upper airway and retrognathia. I had requested an attended sleep study which her insurance had denied.   Epworth sleepiness score: 8/24. 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 There were no positional data extracted .  A clinically relevant degree of hypoxia was not recorded.    RECOMMENDATION:  I would strongly recommend CPAP use and titration, this degree of apnea may even need BiPAP therapy should high pressure settings be necessary. Given history of  Cardiac arrhythmia and retrognathia, I like for the patient to not have to use a FFM, but be given PAP therapy urgently.     INTERPRETING PHYSICIAN:  Larey Seat, MD  [facility] and [facility] Sleep Board certified by The [employer] of Sleep Medicine and  Fellow of the [employer] of Neurology. Medical Director of [employer] [employer].

## 2020-09-24 ENCOUNTER — Other Ambulatory Visit (HOSPITAL_COMMUNITY)
Admission: RE | Admit: 2020-09-24 | Discharge: 2020-09-24 | Disposition: A | Discharge status: Home / Self Care | Payer: 59 | Source: Ambulatory Visit | Attending: Neurosurgery | Admitting: Neurosurgery

## 2020-09-24 DIAGNOSIS — Z01812 Encounter for preprocedural laboratory examination: Secondary | ICD-10-CM | POA: Diagnosis present

## 2020-09-24 DIAGNOSIS — U071 COVID-19: Secondary | ICD-10-CM | POA: Diagnosis not present

## 2020-09-24 LAB — SARS CORONAVIRUS 2 (TAT 6-24 HRS): SARS Coronavirus 2: POSITIVE — AB

## 2020-09-25 NOTE — Progress Notes (Signed)
Called Dr. Wallene Huh with + covid results for this pt. The pt is to have surgery on Mon 09/27/20 at [facility] [place]. The pt has not been notified as there will be pertinent questions the provider needs to answer.   Positive Results for:  Asymptomatic: Procedure postponed and quarantined for 10 days, unless the procedure is urgent. Please follow the facility's protocol regarding pt and family arrival.  Symptomatic: Procedure postponed and quarantined for 14 days.  Hospitalized with Covid: postponed and quarantined  for 21 days.  Immunocompromised: Procedure postponed and quarantine for 20 days.  The pt will not be retested for 90 days from the + result.

## 2020-10-09 ENCOUNTER — Other Ambulatory Visit: Payer: Self-pay | Admitting: Neurology

## 2020-10-09 DIAGNOSIS — I471 Supraventricular tachycardia: Secondary | ICD-10-CM

## 2020-10-09 DIAGNOSIS — G4752 REM sleep behavior disorder: Secondary | ICD-10-CM

## 2020-10-09 DIAGNOSIS — G4733 Obstructive sleep apnea (adult) (pediatric): Secondary | ICD-10-CM | POA: Insufficient documentation

## 2020-10-09 NOTE — Addendum Note (Signed)
Addended by: Larey Seat on: 10/09/2020 01:09 PM   Modules accepted: Orders

## 2020-10-09 NOTE — Procedures (Signed)
[facility] TEST (Watch PAT)  STUDY DATA AVAILABLE: 09/23/20  DOB: 1960-11-21  MRN: [id number]  ORDERING CLINICIAN: Larey Seat, MD   REFERRING CLINICIAN: Crist Infante, MD   CLINICAL INFORMATION/HISTORY: Cathy Edmonston Thompsonis a 60  year old Caucasian female patientand was seenon 06/15/2020 for a sleep medicine consultation requested by Dr. Crist Infante.  Chiefconcern :  When sleeping on her back she has struggled to breathe, has felt palpitations and her mouth trembling. With a known history of bruxism, she has been using a mouth guard.  Cathy Bruce  has a  medical history of ADD (attention deficit disorder), Atrial tachycardia ([place]), Coronary artery disease, non-occlusive (06/2018), DDD (degenerative disc disease), lumbar, Diverticulosis, Dyslipidemia, GERD (gastroesophageal reflux disease), Internal hemorrhoids, Social anxiety disorder, and SVT (supraventricular tachycardia) ([place]) , and had 2 cardiac ablations with Dr Rayann Heman. Her concern is that of REM BD and dream enactment, tremors and nocturia. She has a small upper airway and retrognathia. I had requested an attended sleep study which her insurance had denied.   Epworth sleepiness score: 8/24. 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 There were no positional data extracted .  A clinically relevant degree of hypoxia was not recorded.    RECOMMENDATION:  I would strongly recommend CPAP use and titration, this degree of apnea may even need BiPAP therapy should high pressure settings be necessary. Given history of  Cardiac arrhythmia and retrognathia, I like for the patient to not have to use a FFM, but be given PAP therapy urgently.     INTERPRETING PHYSICIAN:  Larey Seat, MD

## 2020-10-09 NOTE — Progress Notes (Signed)
IMPRESSION: this HST confirmed the presence of very sever sleep apnea with a high AHI of 54.9/h- without significant accentuation in REM sleep. There were no positional data extracted .  A clinically relevant degree of hypoxia was not recorded.   RECOMMENDATION:  I would strongly recommend CPAP use and titration, this degree of apnea may even need BiPAP therapy should high pressure settings be necessary. Given history of  Cardiac arrhythmia and retrognathia, I like for the patient to not have to use a FFM, but be given PAP therapy urgently.   Auto-CPAP 5-20 cm water pressure with heated humidity and 3 cm EPR and non FFM ( nothing resting on the chin)     INTERPRETING PHYSICIAN:  Larey Seat, MD

## 2020-10-11 ENCOUNTER — Telehealth: Payer: Self-pay | Admitting: Neurology

## 2020-10-11 NOTE — Telephone Encounter (Signed)
-----   Message from Larey Seat, MD sent at 10/09/2020  1:09 PM EST ----- IMPRESSION: this HST confirmed the presence of very sever sleep apnea with a high AHI of 54.9/h- without significant accentuation in REM sleep. There were no positional data extracted .  A clinically relevant degree of hypoxia was not recorded.   RECOMMENDATION:  I would strongly recommend CPAP use and titration, this degree of apnea may even need BiPAP therapy should high pressure settings be necessary. Given history of  Cardiac arrhythmia and retrognathia, I like for the patient to not have to use a FFM, but be given PAP therapy urgently.   Auto-CPAP 5-20 cm water pressure with heated humidity and 3 cm EPR and non FFM ( nothing resting on the chin)     INTERPRETING PHYSICIAN:  Larey Seat, MD

## 2020-10-11 NOTE — Telephone Encounter (Signed)
Called patient to discuss sleep study results. No answer at this time. LVM for the patient to call back.   

## 2020-10-11 NOTE — Telephone Encounter (Signed)
Pt returned call. I advised pt that Dr. Brett Fairy reviewed their sleep study results and found that pt has severe sleep apnea. Dr. Brett Fairy recommends that pt starts auto CPAP. I reviewed PAP compliance expectations with the pt. Pt is agreeable to starting a CPAP. I advised pt that an order will be sent to a DME, [facility], and [facility] will call the pt within about one week after they file with the pt's insurance. [facility] will show the pt how to use the machine, fit for masks, and troubleshoot the CPAP if needed. A follow up appt was made for insurance purposes with Debbora Presto, NP on June 20,2022 at 3 pm . Pt verbalized understanding to arrive 15 minutes early and bring their CPAP. A letter with all of this information in it will be sent to the pt as a reminder. Pt verbalized understanding of results. Pt had no questions at this time but was encouraged to call back if questions arise. I have sent the order to [facility] and have received confirmation that they have received the order.

## 2021-01-17 ENCOUNTER — Ambulatory Visit: Payer: Self-pay | Admitting: Family Medicine

## 2021-01-21 ENCOUNTER — Other Ambulatory Visit: Payer: Self-pay | Admitting: Neurosurgery

## 2021-01-28 HISTORY — PX: BACK SURGERY: SHX140

## 2021-02-18 NOTE — Progress Notes (Signed)
Surgical Instructions    Your procedure is scheduled on Wednesday July 27th.  Report to [facility] Main Entrance "A" at 5:30 A.M., then check in with the Admitting office.  Call this number if you have problems the morning of surgery:  [phone]   If you have any questions prior to your surgery date call [phone]: Open Monday-Friday 8am-4pm    Remember:  Do not eat or drink anything after midnight the night before your surgery   Take these medicines the morning of surgery with A SIP OF WATER  metoprolol succinate (TOPROL-XL) 25 MG 24 hr tablet omeprazole (PRILOSEC) 40 MG capsule  IF NEEDED albuterol (VENTOLIN HFA) 108 (90 Base) MCG/ACT inhaler Please bring inhaler with you to the hospital nitroGLYCERIN (NITROSTAT) 0.4 MG SL tablet traMADol (ULTRAM) 50 MG tablet   As of today, STOP taking any Aspirin (unless otherwise instructed by your surgeon) Aleve, Naproxen, Ibuprofen, Motrin, Advil, Goody's, BC's, all herbal medications, fish oil, and all vitamins.          Do not wear jewelry or makeup Do not wear lotions, powders, perfumes, or deodorant. Do not shave 48 hours prior to surgery.   Do not bring valuables to the hospital. DO Not wear nail polish, gel polish, artificial nails, or any other type of covering on natural nails including finger and toenails. If patients have artificial nails, gel coating, etc. that need to be removed by a nail salon please have this removed prior to surgery or surgery may need to be canceled/delayed if the surgeon/ anesthesia feels like the patient is unable to be adequately monitored.             [facility] is not responsible for any belongings or valuables.  Do NOT Smoke (Tobacco/Vaping) or drink Alcohol 24 hours prior to your procedure If you use a CPAP at night, you may bring all equipment for your overnight stay.   Contacts, glasses, dentures or bridgework may not be worn into surgery, please bring cases for these belongings   For  patients admitted to the hospital, discharge time will be determined by your treatment team.   Patients discharged the day of surgery will not be allowed to drive home, and someone needs to stay with them for 24 hours.  ONLY 1 SUPPORT PERSON MAY BE PRESENT WHILE YOU ARE IN SURGERY. IF YOU ARE TO BE ADMITTED ONCE YOU ARE IN YOUR ROOM YOU WILL BE ALLOWED TWO (2) VISITORS.  Minor children may have two parents present. Special consideration for safety and communication needs will be reviewed on a case by case basis.  Special instructions:    Oral Hygiene is also important to reduce your risk of infection.  Remember - BRUSH YOUR TEETH THE MORNING OF SURGERY WITH YOUR REGULAR TOOTHPASTE   [facility]- Preparing For Surgery  Before surgery, you can play an important role. Because skin is not sterile, your skin needs to be as free of germs as possible. You can reduce the number of germs on your skin by washing with CHG (chlorahexidine gluconate) Soap before surgery.  CHG is an antiseptic cleaner which kills germs and bonds with the skin to continue killing germs even after washing.     Please do not use if you have an allergy to CHG or antibacterial soaps. If your skin becomes reddened/irritated stop using the CHG.  Do not shave (including legs and underarms) for at least 48 hours prior to first CHG shower. It is OK to shave your face.  Please follow these instructions carefully.     Shower the NIGHT BEFORE SURGERY and the MORNING OF SURGERY with CHG Soap.   If you chose to wash your hair, wash your hair first as usual with your normal shampoo. After you shampoo, rinse your hair and body thoroughly to remove the shampoo.  Then [employer] and genitals (private parts) with your normal soap and rinse thoroughly to remove soap.  After that Use CHG Soap as you would any other liquid soap. You can apply CHG directly to the skin and wash gently with a scrungie or a clean washcloth.   Apply the CHG Soap to  your body ONLY FROM THE NECK DOWN.  Do not use on open wounds or open sores. Avoid contact with your eyes, ears, mouth and genitals (private parts). Wash Face and genitals (private parts)  with your normal soap.   Wash thoroughly, paying special attention to the area where your surgery will be performed.  Thoroughly rinse your body with warm water from the neck down.  DO NOT shower/wash with your normal soap after using and rinsing off the CHG Soap.  Pat yourself dry with a CLEAN TOWEL.  Wear CLEAN PAJAMAS to bed the night before surgery  Place CLEAN SHEETS on your bed the night before your surgery  DO NOT SLEEP WITH PETS.   Day of Surgery:  Take a shower with CHG soap. Wear Clean/Comfortable clothing the morning of surgery Do not apply any deodorants/lotions.   Remember to brush your teeth WITH YOUR REGULAR TOOTHPASTE.   Please read over the following fact sheets that you were given.

## 2021-02-21 ENCOUNTER — Other Ambulatory Visit: Payer: Self-pay

## 2021-02-21 ENCOUNTER — Encounter (HOSPITAL_COMMUNITY)
Admission: RE | Admit: 2021-02-21 | Discharge: 2021-02-21 | Disposition: A | Discharge status: Home / Self Care | Payer: 59 | Source: Ambulatory Visit | Attending: Neurosurgery | Admitting: Neurosurgery

## 2021-02-21 ENCOUNTER — Encounter (HOSPITAL_COMMUNITY): Payer: Self-pay

## 2021-02-21 DIAGNOSIS — Z01812 Encounter for preprocedural laboratory examination: Secondary | ICD-10-CM | POA: Insufficient documentation

## 2021-02-21 DIAGNOSIS — Z20822 Contact with and (suspected) exposure to covid-19: Secondary | ICD-10-CM | POA: Insufficient documentation

## 2021-02-21 HISTORY — DX: Cardiac arrhythmia, unspecified: I49.9

## 2021-02-21 HISTORY — DX: Sleep apnea, unspecified: G47.30

## 2021-02-21 HISTORY — DX: Pneumonia, unspecified organism: J18.9

## 2021-02-21 LAB — CBC
HCT: 40.6 % (ref 36.0–46.0)
Hemoglobin: 14.1 g/dL (ref 12.0–15.0)
MCH: 29.7 pg (ref 26.0–34.0)
MCHC: 34.7 g/dL (ref 30.0–36.0)
MCV: 85.7 fL (ref 80.0–100.0)
Platelets: 278 10*3/uL (ref 150–400)
RBC: 4.74 MIL/uL (ref 3.87–5.11)
RDW: 12.9 % (ref 11.5–15.5)
WBC: 8 10*3/uL (ref 4.0–10.5)
nRBC: 0 % (ref 0.0–0.2)

## 2021-02-21 LAB — BASIC METABOLIC PANEL
Anion gap: 6 (ref 5–15)
BUN: 6 mg/dL (ref 6–20)
CO2: 26 mmol/L (ref 22–32)
Calcium: 9.1 mg/dL (ref 8.9–10.3)
Chloride: 107 mmol/L (ref 98–111)
Creatinine, Ser: 0.78 mg/dL (ref 0.44–1.00)
GFR, Estimated: >60 mL/min (ref >60)
Glucose, Bld: 117 mg/dL — ABNORMAL HIGH (ref 70–99)
Potassium: 3.4 mmol/L — ABNORMAL LOW (ref 3.5–5.1)
Sodium: 139 mmol/L (ref 135–145)

## 2021-02-21 LAB — SURGICAL PCR SCREEN
MRSA, PCR: NEGATIVE
Staphylococcus aureus: POSITIVE — AB

## 2021-02-21 LAB — SARS CORONAVIRUS 2 (TAT 6-24 HRS): SARS Coronavirus 2: NEGATIVE

## 2021-02-21 LAB — TYPE AND SCREEN
ABO/RH(D): A POS
Antibody Screen: NEGATIVE

## 2021-02-21 NOTE — Progress Notes (Signed)
PCP - Crist Infante, MD Cardiologist - Glenetta Hew, MD  PPM/ICD - denies Device Orders - N/A Rep Notified - N/A  Chest x-ray - N/A EKG - 03/10/2020 Stress Test - 08/06/2013 ECHO - 08/17/2013 Cardiac Cath - more than 10 yrs ago  Sleep Study - yes CPAP - patient is waiting to be provided  Fasting Blood Sugar - N/A  Blood Thinner Instructions: N/A  Aspirin Instructions: Aspirin - last dose 02/15/2021 per patient  Patient was instructed: As of today, STOP taking any Aspirin (unless otherwise instructed by your surgeon) Aleve, Naproxen, Ibuprofen, Motrin, Advil, Goody's, BC's, all herbal medications, fish oil, and all vitamins.  ERAS Protcol - no PRE-SURGERY Ensure or G2- N/A  COVID TEST- 02/21/2021   Anesthesia review: yes; cardiac history  Patient denies shortness of breath, fever, cough and chest pain at PAT appointment   All instructions explained to the patient, with a verbal understanding of the material. Patient agrees to go over the instructions while at home for a better understanding. Patient also instructed to self quarantine after being tested for COVID-19. The opportunity to ask questions was provided.

## 2021-02-22 NOTE — Anesthesia Preprocedure Evaluation (Addendum)
Anesthesia Evaluation  Patient identified by MRN, date of birth, ID band Patient awake    Reviewed: Allergy & Precautions, NPO status , Patient's Chart, lab work & pertinent test results  Airway Mallampati: III  TM Distance: >3 FB Neck ROM: Full    Dental  (+) Teeth Intact, Dental Advisory Given   Pulmonary sleep apnea ,    breath sounds clear to auscultation       Cardiovascular + CAD  + dysrhythmias Atrial Fibrillation  Rhythm:Regular Rate:Normal     Neuro/Psych PSYCHIATRIC DISORDERS Anxiety negative neurological ROS     GI/Hepatic Neg liver ROS, GERD  Medicated,  Endo/Other  negative endocrine ROS  Renal/GU negative Renal ROS     Musculoskeletal  (+) Arthritis ,   Abdominal Normal abdominal exam  (+)   Peds  Hematology negative hematology ROS (+)   Anesthesia Other Findings   Reproductive/Obstetrics                            Anesthesia Physical Anesthesia Plan  ASA: 3  Anesthesia Plan: General   Post-op Pain Management:    Induction: Intravenous  PONV Risk Score and Plan: 4 or greater and Ondansetron, Dexamethasone, Midazolam and Scopolamine patch - Pre-op  Airway Management Planned: Oral ETT  Additional Equipment: None  Intra-op Plan:   Post-operative Plan: Extubation in OR  Informed Consent: I have reviewed the patients History and Physical, chart, labs and discussed the procedure including the risks, benefits and alternatives for the proposed anesthesia with the patient or authorized representative who has indicated his/her understanding and acceptance.     Dental advisory given  Plan Discussed with: CRNA  Anesthesia Plan Comments: (PAT note by Karoline Caldwell, PA-C: Follows with cardiology for history of atrial tachycardia s/pablation x 2 with no breakthrough episodes, elevated coronary calcium score with nonobstructive CAD by coronary CTA in December 2019. Last seen  by Dr. Ellyn Hack 03/10/2020. Per note, she was doing well from CV standpoint. She is maintained on zetia, statin, and praluent for lipid management. No changes made to therapy. Advised to followup in 2 years.   Recent diagnosis of severe OSA February 2022. Seen by neurology for this. Pt states she is still waiting for CPAP machine. Per neurology note 09/20/20, "IMPRESSION: this HST confirmed the presence of very sever sleep apnea with a high AHI of 54.9/h- without significant accentuation in REM sleep. There were no positional data extracted . A clinically relevant degree of hypoxia was not recorded. RECOMMENDATION: I would strongly recommend CPAP use and titration, this degree of apnea may even need BiPAP therapy should high pressure settings be necessary. Given history of Cardiac arrhythmia and retrognathia, I like for the patient to not have to use a FFM, but be given PAP therapy urgently. Auto-CPAP 5-20 cm water pressure with heated humidity and 3 cm EPR and non FFM ( nothing resting on the chin)."  Preop labs reviewed, unremarkable.   EKG 04/10/20: NSR. Rate 70/ Possible LAE. Low voltage QRS.  Coronary CTA 07/10/2018: Heavily calcified pLAD - moderate stenosis (appears >70%). FFR 0.95, 0.82 distal (Not significant). Cx (p 0.98 - d 0.96). RCA (p 0.99, m 0.96, d 0.92)  CT Coronary Morphology with FFR 07/09/2018: 1. LAD and LCX with separate ostia.  2. LAD: No significant stenosis. FFRct 0.95 proximally, 0.91 mid, 0.82 distal. 3. LCX: No significant stenosis. FFRct 0.98 proximally, 0.97 mid, 0.96 distal. 4. RCA: No significant stenosis. FFRct 0.99 proximally, 0.96 mid,  0.92 distal.  IMPRESSION: 1. CT FFR analysis didn't show any significant stenosis.  2. Given heavily calcified proximal LAD with at least moderate stenosis, recommend aggressive risk factor modification including aspirin and high potency statin.  TTE 08/07/2013: - Left ventricle: The cavity size was normal. Systolic   function was normal. The estimated ejection fraction was  in the range of 55% to 60%. Wall motion was normal; there  were no regional wall motion abnormalities. Features are  consistent with a pseudonormal left ventricular filling  pattern, with concomitant abnormal relaxationwithout  increased filling pressure (grade 2 diastolic  dysfunction).  - Aortic valve: Mild regurgitation.  - Mitral valve: Mild regurgitation.  Impressions:   - Compared to the prior study, there has been no significant  interval change.  )      Anesthesia Quick Evaluation

## 2021-02-22 NOTE — Progress Notes (Signed)
Anesthesia Chart Review:  Follows with cardiology for history of atrial tachycardia s/p ablation x 2 with no breakthrough episodes, elevated coronary calcium score with nonobstructive CAD by coronary CTA in December 2019. Last seen by Dr. Ellyn Hack 03/10/2020. Per note, she was doing well from CV standpoint. She is maintained on zetia, statin, and praluent for lipid management. No changes made to therapy. Advised to followup in 2 years.   Recent diagnosis of severe OSA February 2022. Seen by neurology for this. Pt states she is still waiting for CPAP machine. Per neurology note 09/20/20, "IMPRESSION: this HST confirmed the presence of very sever sleep apnea with a high AHI of 54.9/h- without significant accentuation in REM sleep. There were no positional data extracted .  A clinically relevant degree of hypoxia was not recorded. RECOMMENDATION: I would strongly recommend CPAP use and titration, this degree of apnea may even need BiPAP therapy should high pressure settings be necessary. Given history of  Cardiac arrhythmia and retrognathia, I like for the patient to not have to use a FFM, but be given PAP therapy urgently. Auto-CPAP 5-20 cm water pressure with heated humidity and 3 cm EPR and non FFM ( nothing resting on the chin)."  Preop labs reviewed, unremarkable.   EKG 04/10/20: NSR. Rate 70/ Possible LAE. Low voltage QRS.  Coronary CTA 07/10/2018: Heavily calcified pLAD - moderate stenosis (appears > 70%).  FFR 0.95, 0.82 distal (Not significant). Cx (p 0.98 - d 0.96). RCA (p 0.99, m 0.96, d 0.92)  CT Coronary Morphology with FFR 07/09/2018: 1. LAD and LCX with separate ostia.   2. LAD: No significant stenosis. FFRct 0.95 proximally, 0.91 mid, 0.82 distal. 3. LCX: No significant stenosis. FFRct 0.98 proximally, 0.97 mid, 0.96 distal. 4. RCA: No significant stenosis. FFRct 0.99 proximally, 0.96 mid, 0.92 distal.   IMPRESSION: 1.  CT FFR analysis didn't show any significant stenosis.   2.  Given heavily calcified proximal LAD with at least moderate stenosis, recommend aggressive risk factor modification including aspirin and high potency statin.  TTE 08/07/2013: - Left ventricle: The cavity size was normal. Systolic    function was normal. The estimated ejection fraction was    in the range of 55% to 60%. Wall motion was normal; there    were no regional wall motion abnormalities. Features are    consistent with a pseudonormal left ventricular filling    pattern, with concomitant abnormal relaxationwithout    increased filling pressure (grade 2 diastolic    dysfunction).  - Aortic valve: Mild regurgitation.  - Mitral valve: Mild regurgitation.  Impressions:   - Compared to the prior study, there has been no significant    interval change.   Wynonia Musty [facility] Short Stay Center/Anesthesiology Phone [phone] 02/22/2021 10:48 AM

## 2021-02-23 ENCOUNTER — Inpatient Hospital Stay (HOSPITAL_COMMUNITY)
Admission: RE | Disposition: A | Discharge status: Home / Self Care | Payer: Self-pay | Source: Home / Self Care | Attending: Neurosurgery

## 2021-02-23 ENCOUNTER — Encounter (HOSPITAL_COMMUNITY): Payer: Self-pay | Admitting: Neurosurgery

## 2021-02-23 ENCOUNTER — Inpatient Hospital Stay (HOSPITAL_COMMUNITY): Payer: 59

## 2021-02-23 ENCOUNTER — Inpatient Hospital Stay (HOSPITAL_COMMUNITY): Payer: 59 | Admitting: Physician Assistant

## 2021-02-23 ENCOUNTER — Other Ambulatory Visit: Payer: Self-pay

## 2021-02-23 ENCOUNTER — Inpatient Hospital Stay (HOSPITAL_COMMUNITY): Payer: 59 | Admitting: Certified Registered"

## 2021-02-23 ENCOUNTER — Inpatient Hospital Stay (HOSPITAL_COMMUNITY)
Admission: RE | Admit: 2021-02-23 | Discharge: 2021-02-26 | DRG: 455 | Disposition: A | Discharge status: Home / Self Care | Payer: 59 | Attending: Neurosurgery | Admitting: Neurosurgery

## 2021-02-23 DIAGNOSIS — Z7982 Long term (current) use of aspirin: Secondary | ICD-10-CM | POA: Diagnosis not present

## 2021-02-23 DIAGNOSIS — Z79818 Long term (current) use of other agents affecting estrogen receptors and estrogen levels: Secondary | ICD-10-CM

## 2021-02-23 DIAGNOSIS — E785 Hyperlipidemia, unspecified: Secondary | ICD-10-CM | POA: Diagnosis present

## 2021-02-23 DIAGNOSIS — M5117 Intervertebral disc disorders with radiculopathy, lumbosacral region: Secondary | ICD-10-CM | POA: Diagnosis present

## 2021-02-23 DIAGNOSIS — Z20822 Contact with and (suspected) exposure to covid-19: Secondary | ICD-10-CM | POA: Diagnosis present

## 2021-02-23 DIAGNOSIS — K219 Gastro-esophageal reflux disease without esophagitis: Secondary | ICD-10-CM | POA: Diagnosis present

## 2021-02-23 DIAGNOSIS — Z79899 Other long term (current) drug therapy: Secondary | ICD-10-CM

## 2021-02-23 DIAGNOSIS — M48061 Spinal stenosis, lumbar region without neurogenic claudication: Secondary | ICD-10-CM | POA: Diagnosis present

## 2021-02-23 DIAGNOSIS — I4891 Unspecified atrial fibrillation: Secondary | ICD-10-CM | POA: Diagnosis present

## 2021-02-23 DIAGNOSIS — M5116 Intervertebral disc disorders with radiculopathy, lumbar region: Secondary | ICD-10-CM | POA: Diagnosis present

## 2021-02-23 DIAGNOSIS — M4807 Spinal stenosis, lumbosacral region: Secondary | ICD-10-CM | POA: Diagnosis present

## 2021-02-23 DIAGNOSIS — Z8249 Family history of ischemic heart disease and other diseases of the circulatory system: Secondary | ICD-10-CM

## 2021-02-23 DIAGNOSIS — G4730 Sleep apnea, unspecified: Secondary | ICD-10-CM | POA: Diagnosis present

## 2021-02-23 DIAGNOSIS — M4316 Spondylolisthesis, lumbar region: Secondary | ICD-10-CM | POA: Diagnosis present

## 2021-02-23 DIAGNOSIS — Z419 Encounter for procedure for purposes other than remedying health state, unspecified: Secondary | ICD-10-CM

## 2021-02-23 DIAGNOSIS — M4317 Spondylolisthesis, lumbosacral region: Secondary | ICD-10-CM | POA: Diagnosis present

## 2021-02-23 DIAGNOSIS — I2510 Atherosclerotic heart disease of native coronary artery without angina pectoris: Secondary | ICD-10-CM | POA: Diagnosis present

## 2021-02-23 SURGERY — POSTERIOR LUMBAR FUSION 2 LEVEL
Anesthesia: General

## 2021-02-23 MED ORDER — ORAL CARE MOUTH RINSE: 15.0000 mL | Freq: Once | OROMUCOSAL | Status: AC

## 2021-02-23 MED ORDER — CHLORHEXIDINE GLUCONATE 0.12 % MT SOLN
15.0000 mL | Freq: Once | OROMUCOSAL | Status: DC
Start: 2021-02-23 — End: 2021-02-23

## 2021-02-23 MED ORDER — ALBUTEROL SULFATE HFA 108 (90 BASE) MCG/ACT IN AERS: 1.0000 | INHALATION_SPRAY | Freq: Four times a day (QID) | RESPIRATORY_TRACT | Status: DC | PRN

## 2021-02-23 MED ORDER — LIDOCAINE 2% (20 MG/ML) 5 ML SYRINGE
INTRAMUSCULAR | Status: AC
  Filled 2021-02-23: qty 5

## 2021-02-23 MED ORDER — ACETAMINOPHEN 160 MG/5ML PO SOLN: 325.0000 mg | Freq: Once | ORAL | Status: DC | PRN

## 2021-02-23 MED ORDER — KETAMINE HCL 10 MG/ML IJ SOLN
INTRAMUSCULAR | Status: DC | PRN
  Administered 2021-02-23: 25 mg via INTRAVENOUS

## 2021-02-23 MED ORDER — BUPIVACAINE-EPINEPHRINE (PF) 0.5% -1:200000 IJ SOLN
INTRAMUSCULAR | Status: DC | PRN
  Administered 2021-02-23: 10 mL via PERINEURAL

## 2021-02-23 MED ORDER — ACETAMINOPHEN 10 MG/ML IV SOLN
INTRAVENOUS | Status: AC
  Filled 2021-02-23: qty 100

## 2021-02-23 MED ORDER — PHENYLEPHRINE HCL-NACL 10-0.9 MG/250ML-% IV SOLN
INTRAVENOUS | Status: DC | PRN
  Administered 2021-02-23: 25 ug/min via INTRAVENOUS

## 2021-02-23 MED ORDER — ONDANSETRON HCL 4 MG/2ML IJ SOLN
INTRAMUSCULAR | Status: AC
  Filled 2021-02-23: qty 2

## 2021-02-23 MED ORDER — MIDAZOLAM HCL 2 MG/2ML IJ SOLN
INTRAMUSCULAR | Status: AC
  Filled 2021-02-23: qty 2

## 2021-02-23 MED ORDER — ACETAMINOPHEN 325 MG PO TABS: 325.0000 mg | ORAL_TABLET | Freq: Once | ORAL | Status: DC | PRN

## 2021-02-23 MED ORDER — FENTANYL CITRATE (PF) 100 MCG/2ML IJ SOLN
INTRAMUSCULAR | Status: DC | PRN
  Administered 2021-02-23: 100 ug via INTRAVENOUS
  Administered 2021-02-23 (×3): 50 ug via INTRAVENOUS

## 2021-02-23 MED ORDER — ACETAMINOPHEN 500 MG PO TABS
1000.0000 mg | ORAL_TABLET | Freq: Four times a day (QID) | ORAL | Status: AC
  Administered 2021-02-23 – 2021-02-24 (×4): 1000 mg via ORAL
  Filled 2021-02-23 (×4): qty 2

## 2021-02-23 MED ORDER — THROMBIN 5000 UNITS EX SOLR
OROMUCOSAL | Status: DC | PRN
  Administered 2021-02-23 (×2): 5 mL via TOPICAL

## 2021-02-23 MED ORDER — PROPOFOL 10 MG/ML IV BOLUS
INTRAVENOUS | Status: AC
  Filled 2021-02-23: qty 20

## 2021-02-23 MED ORDER — TRAMADOL HCL 50 MG PO TABS: 50.0000 mg | ORAL_TABLET | Freq: Four times a day (QID) | ORAL | Status: DC | PRN

## 2021-02-23 MED ORDER — ROCURONIUM BROMIDE 10 MG/ML (PF) SYRINGE
PREFILLED_SYRINGE | INTRAVENOUS | Status: AC
  Filled 2021-02-23: qty 10

## 2021-02-23 MED ORDER — LIDOCAINE 2% (20 MG/ML) 5 ML SYRINGE
INTRAMUSCULAR | Status: DC | PRN
  Administered 2021-02-23: 40 mg via INTRAVENOUS

## 2021-02-23 MED ORDER — FLUOXETINE HCL 20 MG PO CAPS
20.0000 mg | ORAL_CAPSULE | Freq: Every day | ORAL | Status: DC
  Administered 2021-02-23 – 2021-02-25 (×3): 20 mg via ORAL
  Filled 2021-02-23 (×3): qty 1

## 2021-02-23 MED ORDER — ROCURONIUM BROMIDE 10 MG/ML (PF) SYRINGE
PREFILLED_SYRINGE | INTRAVENOUS | Status: DC | PRN
  Administered 2021-02-23: 20 mg via INTRAVENOUS
  Administered 2021-02-23: 50 mg via INTRAVENOUS
  Administered 2021-02-23 (×2): 20 mg via INTRAVENOUS

## 2021-02-23 MED ORDER — MEPERIDINE HCL 25 MG/ML IJ SOLN: 6.2500 mg | INTRAMUSCULAR | Status: DC | PRN

## 2021-02-23 MED ORDER — MENTHOL 3 MG MT LOZG: 1.0000 | LOZENGE | OROMUCOSAL | Status: DC | PRN

## 2021-02-23 MED ORDER — ONDANSETRON HCL 4 MG PO TABS: 4.0000 mg | ORAL_TABLET | Freq: Four times a day (QID) | ORAL | Status: DC | PRN

## 2021-02-23 MED ORDER — HYDROMORPHONE HCL 1 MG/ML IJ SOLN
INTRAMUSCULAR | Status: DC | PRN
  Administered 2021-02-23: .5 mg via INTRAVENOUS

## 2021-02-23 MED ORDER — OXYCODONE HCL 5 MG PO TABS
5.0000 mg | ORAL_TABLET | ORAL | Status: DC | PRN
  Administered 2021-02-24 (×2): 5 mg via ORAL
  Filled 2021-02-23 (×2): qty 1

## 2021-02-23 MED ORDER — CEFAZOLIN SODIUM 1 G IJ SOLR
INTRAMUSCULAR | Status: AC
  Filled 2021-02-23: qty 20

## 2021-02-23 MED ORDER — SIMVASTATIN 20 MG PO TABS
40.0000 mg | ORAL_TABLET | Freq: Every day | ORAL | Status: DC
  Administered 2021-02-23 – 2021-02-25 (×3): 40 mg via ORAL
  Filled 2021-02-23 (×3): qty 2

## 2021-02-23 MED ORDER — SODIUM CHLORIDE 0.9% FLUSH: 3.0000 mL | INTRAVENOUS | Status: DC | PRN

## 2021-02-23 MED ORDER — BUPIVACAINE LIPOSOME 1.3 % IJ SUSP
INTRAMUSCULAR | Status: AC
  Filled 2021-02-23: qty 20

## 2021-02-23 MED ORDER — PROPOFOL 1000 MG/100ML IV EMUL
INTRAVENOUS | Status: AC
  Filled 2021-02-23: qty 100

## 2021-02-23 MED ORDER — PHENYLEPHRINE 40 MCG/ML (10ML) SYRINGE FOR IV PUSH (FOR BLOOD PRESSURE SUPPORT)
PREFILLED_SYRINGE | INTRAVENOUS | Status: DC | PRN
  Administered 2021-02-23 (×2): 120 ug via INTRAVENOUS
  Administered 2021-02-23: 40 ug via INTRAVENOUS
  Administered 2021-02-23: 120 ug via INTRAVENOUS

## 2021-02-23 MED ORDER — NITROGLYCERIN 0.4 MG SL SUBL: 0.4000 mg | SUBLINGUAL_TABLET | SUBLINGUAL | Status: DC | PRN

## 2021-02-23 MED ORDER — FENTANYL CITRATE (PF) 250 MCG/5ML IJ SOLN
INTRAMUSCULAR | Status: AC
  Filled 2021-02-23: qty 5

## 2021-02-23 MED ORDER — HYDROMORPHONE HCL 1 MG/ML IJ SOLN
INTRAMUSCULAR | Status: AC
  Filled 2021-02-23: qty 0.5

## 2021-02-23 MED ORDER — LACTATED RINGERS IV SOLN: INTRAVENOUS | Status: DC

## 2021-02-23 MED ORDER — MORPHINE SULFATE (PF) 4 MG/ML IV SOLN: 4.0000 mg | INTRAVENOUS | Status: DC | PRN

## 2021-02-23 MED ORDER — DEXAMETHASONE SODIUM PHOSPHATE 10 MG/ML IJ SOLN
INTRAMUSCULAR | Status: AC
  Filled 2021-02-23: qty 1

## 2021-02-23 MED ORDER — CHLORHEXIDINE GLUCONATE CLOTH 2 % EX PADS
6.0000 | MEDICATED_PAD | Freq: Once | CUTANEOUS | Status: DC
Start: 2021-02-23 — End: 2021-02-23

## 2021-02-23 MED ORDER — ONDANSETRON HCL 4 MG/2ML IJ SOLN: 4.0000 mg | Freq: Four times a day (QID) | INTRAMUSCULAR | Status: DC | PRN

## 2021-02-23 MED ORDER — EPHEDRINE SULFATE 50 MG/ML IJ SOLN
INTRAMUSCULAR | Status: DC | PRN
  Administered 2021-02-23: 15 mg via INTRAVENOUS
  Administered 2021-02-23: 5 mg via INTRAVENOUS

## 2021-02-23 MED ORDER — PANTOPRAZOLE SODIUM 40 MG PO TBEC
80.0000 mg | DELAYED_RELEASE_TABLET | Freq: Every day | ORAL | Status: DC
  Administered 2021-02-24 – 2021-02-26 (×3): 80 mg via ORAL
  Filled 2021-02-23 (×4): qty 2

## 2021-02-23 MED ORDER — PHENYLEPHRINE 40 MCG/ML (10ML) SYRINGE FOR IV PUSH (FOR BLOOD PRESSURE SUPPORT)
PREFILLED_SYRINGE | INTRAVENOUS | Status: AC
  Filled 2021-02-23: qty 10

## 2021-02-23 MED ORDER — BACITRACIN ZINC 500 UNIT/GM EX OINT
TOPICAL_OINTMENT | CUTANEOUS | Status: AC
  Filled 2021-02-23: qty 28.35

## 2021-02-23 MED ORDER — GLYCOPYRROLATE PF 0.2 MG/ML IJ SOSY
PREFILLED_SYRINGE | INTRAMUSCULAR | Status: DC | PRN
  Administered 2021-02-23: .2 mg via INTRAVENOUS

## 2021-02-23 MED ORDER — SODIUM CHLORIDE 0.9 % IV SOLN: 250.0000 mL | INTRAVENOUS | Status: DC

## 2021-02-23 MED ORDER — ALUM & MAG HYDROXIDE-SIMETH 200-200-20 MG/5ML PO SUSP
30.0000 mL | ORAL | Status: DC | PRN
  Administered 2021-02-23: 30 mL via ORAL
  Filled 2021-02-23: qty 30

## 2021-02-23 MED ORDER — CEFAZOLIN SODIUM-DEXTROSE 2-4 GM/100ML-% IV SOLN
2.0000 g | Freq: Three times a day (TID) | INTRAVENOUS | Status: AC
  Administered 2021-02-23 – 2021-02-24 (×2): 2 g via INTRAVENOUS
  Filled 2021-02-23 (×2): qty 100

## 2021-02-23 MED ORDER — CHLORHEXIDINE GLUCONATE 0.12 % MT SOLN
15.0000 mL | Freq: Once | OROMUCOSAL | Status: AC
  Administered 2021-02-23: 15 mL via OROMUCOSAL
  Filled 2021-02-23: qty 15

## 2021-02-23 MED ORDER — DEXAMETHASONE SODIUM PHOSPHATE 10 MG/ML IJ SOLN
INTRAMUSCULAR | Status: DC | PRN
  Administered 2021-02-23: 4 mg via INTRAVENOUS

## 2021-02-23 MED ORDER — SODIUM CHLORIDE 0.9% FLUSH
3.0000 mL | Freq: Two times a day (BID) | INTRAVENOUS | Status: DC
  Administered 2021-02-23 – 2021-02-25 (×4): 3 mL via INTRAVENOUS

## 2021-02-23 MED ORDER — AMISULPRIDE (ANTIEMETIC) 5 MG/2ML IV SOLN: 10.0000 mg | Freq: Once | INTRAVENOUS | Status: DC | PRN

## 2021-02-23 MED ORDER — SUGAMMADEX SODIUM 200 MG/2ML IV SOLN
INTRAVENOUS | Status: DC | PRN
  Administered 2021-02-23: 200 mg via INTRAVENOUS

## 2021-02-23 MED ORDER — SENNOSIDES-DOCUSATE SODIUM 8.6-50 MG PO TABS
8.0000 | ORAL_TABLET | Freq: Every day | ORAL | Status: DC
  Administered 2021-02-23 – 2021-02-25 (×3): 8 via ORAL
  Filled 2021-02-23 (×3): qty 8

## 2021-02-23 MED ORDER — ONDANSETRON HCL 4 MG/2ML IJ SOLN
INTRAMUSCULAR | Status: DC | PRN
  Administered 2021-02-23: 4 mg via INTRAVENOUS

## 2021-02-23 MED ORDER — 0.9 % SODIUM CHLORIDE (POUR BTL) OPTIME
TOPICAL | Status: DC | PRN
  Administered 2021-02-23: 1000 mL

## 2021-02-23 MED ORDER — ACETAMINOPHEN 10 MG/ML IV SOLN
INTRAVENOUS | Status: DC | PRN
  Administered 2021-02-23: 1000 mg via INTRAVENOUS

## 2021-02-23 MED ORDER — SCOPOLAMINE 1 MG/3DAYS TD PT72
1.0000 | MEDICATED_PATCH | TRANSDERMAL | Status: DC
  Administered 2021-02-23: 1 via TRANSDERMAL
  Filled 2021-02-23: qty 1

## 2021-02-23 MED ORDER — ACETAMINOPHEN 325 MG PO TABS
650.0000 mg | ORAL_TABLET | ORAL | Status: DC | PRN
  Administered 2021-02-25: 650 mg via ORAL
  Filled 2021-02-23: qty 2

## 2021-02-23 MED ORDER — PROMETHAZINE HCL 25 MG/ML IJ SOLN: 6.2500 mg | INTRAMUSCULAR | Status: DC | PRN

## 2021-02-23 MED ORDER — PROPOFOL 10 MG/ML IV BOLUS
INTRAVENOUS | Status: DC | PRN
  Administered 2021-02-23: 150 mg via INTRAVENOUS
  Administered 2021-02-23: 30 mg via INTRAVENOUS

## 2021-02-23 MED ORDER — DOCUSATE SODIUM 100 MG PO CAPS
100.0000 mg | ORAL_CAPSULE | Freq: Two times a day (BID) | ORAL | Status: DC
  Administered 2021-02-23 – 2021-02-26 (×6): 100 mg via ORAL
  Filled 2021-02-23 (×6): qty 1

## 2021-02-23 MED ORDER — HYDROMORPHONE HCL 1 MG/ML IJ SOLN
INTRAMUSCULAR | Status: AC
  Filled 2021-02-23: qty 1

## 2021-02-23 MED ORDER — BUPIVACAINE LIPOSOME 1.3 % IJ SUSP
INTRAMUSCULAR | Status: DC | PRN
  Administered 2021-02-23: 20 mL

## 2021-02-23 MED ORDER — ORAL CARE MOUTH RINSE: 15.0000 mL | Freq: Once | OROMUCOSAL | Status: DC

## 2021-02-23 MED ORDER — PHENOL 1.4 % MT LIQD: 1.0000 | OROMUCOSAL | Status: DC | PRN

## 2021-02-23 MED ORDER — ACETAMINOPHEN 10 MG/ML IV SOLN: 1000.0000 mg | Freq: Once | INTRAVENOUS | Status: DC | PRN

## 2021-02-23 MED ORDER — THROMBIN 5000 UNITS EX SOLR
CUTANEOUS | Status: AC
  Filled 2021-02-23: qty 5000

## 2021-02-23 MED ORDER — BUPIVACAINE-EPINEPHRINE 0.5% -1:200000 IJ SOLN
INTRAMUSCULAR | Status: AC
  Filled 2021-02-23: qty 1

## 2021-02-23 MED ORDER — BACITRACIN ZINC 500 UNIT/GM EX OINT
TOPICAL_OINTMENT | CUTANEOUS | Status: DC | PRN
  Administered 2021-02-23: 1 via TOPICAL

## 2021-02-23 MED ORDER — CHLORHEXIDINE GLUCONATE CLOTH 2 % EX PADS: 6.0000 | MEDICATED_PAD | Freq: Once | CUTANEOUS | Status: DC

## 2021-02-23 MED ORDER — KETAMINE HCL 50 MG/5ML IJ SOSY
PREFILLED_SYRINGE | INTRAMUSCULAR | Status: AC
  Filled 2021-02-23: qty 5

## 2021-02-23 MED ORDER — CYCLOBENZAPRINE HCL 10 MG PO TABS
10.0000 mg | ORAL_TABLET | Freq: Three times a day (TID) | ORAL | Status: DC | PRN
  Administered 2021-02-23 – 2021-02-25 (×5): 10 mg via ORAL
  Filled 2021-02-23 (×5): qty 1

## 2021-02-23 MED ORDER — OXYCODONE HCL 5 MG PO TABS
10.0000 mg | ORAL_TABLET | ORAL | Status: DC | PRN
Start: 2021-02-23 — End: 2021-02-25
  Administered 2021-02-23 – 2021-02-25 (×9): 10 mg via ORAL
  Filled 2021-02-23 (×9): qty 2

## 2021-02-23 MED ORDER — ZOLPIDEM TARTRATE 5 MG PO TABS: 5.0000 mg | ORAL_TABLET | Freq: Every evening | ORAL | Status: DC | PRN

## 2021-02-23 MED ORDER — EPHEDRINE 5 MG/ML INJ
INTRAVENOUS | Status: AC
  Filled 2021-02-23: qty 5

## 2021-02-23 MED ORDER — LACTATED RINGERS IV SOLN: INTRAVENOUS | Status: DC | PRN

## 2021-02-23 MED ORDER — EZETIMIBE 10 MG PO TABS
10.0000 mg | ORAL_TABLET | Freq: Every day | ORAL | Status: DC
  Administered 2021-02-23 – 2021-02-25 (×3): 10 mg via ORAL
  Filled 2021-02-23 (×4): qty 1

## 2021-02-23 MED ORDER — BISACODYL 10 MG RE SUPP: 10.0000 mg | Freq: Every day | RECTAL | Status: DC | PRN

## 2021-02-23 MED ORDER — CEFAZOLIN SODIUM-DEXTROSE 2-4 GM/100ML-% IV SOLN
2.0000 g | INTRAVENOUS | Status: AC
  Administered 2021-02-23 (×2): 2 g via INTRAVENOUS
  Filled 2021-02-23: qty 100

## 2021-02-23 MED ORDER — METOPROLOL SUCCINATE ER 25 MG PO TB24
25.0000 mg | ORAL_TABLET | Freq: Every day | ORAL | Status: DC
  Administered 2021-02-24 – 2021-02-26 (×3): 25 mg via ORAL
  Filled 2021-02-23 (×3): qty 1

## 2021-02-23 MED ORDER — GLYCOPYRROLATE PF 0.2 MG/ML IJ SOSY
PREFILLED_SYRINGE | INTRAMUSCULAR | Status: AC
  Filled 2021-02-23: qty 1

## 2021-02-23 MED ORDER — MIDAZOLAM HCL 5 MG/5ML IJ SOLN
INTRAMUSCULAR | Status: DC | PRN
  Administered 2021-02-23: 2 mg via INTRAVENOUS

## 2021-02-23 MED ORDER — HYDROMORPHONE HCL 1 MG/ML IJ SOLN
0.2500 mg | INTRAMUSCULAR | Status: DC | PRN
  Administered 2021-02-23 (×2): 0.5 mg via INTRAVENOUS

## 2021-02-23 MED ORDER — ACETAMINOPHEN 650 MG RE SUPP: 650.0000 mg | RECTAL | Status: DC | PRN

## 2021-02-23 SURGICAL SUPPLY — 67 items
APL SKNCLS STERI-STRIP NONHPOA (GAUZE/BANDAGES/DRESSINGS) ×1
BAG COUNTER SPONGE SURGICOUNT (BAG) ×4 IMPLANT
BAG SPNG CNTER NS LX DISP (BAG) ×3
BASKET BONE COLLECTION (BASKET) ×2 IMPLANT
BENZOIN TINCTURE PRP APPL 2/3 (GAUZE/BANDAGES/DRESSINGS) ×2 IMPLANT
BLADE CLIPPER SURG (BLADE) IMPLANT
BUR MATCHSTICK NEURO 3.0 LAGG (BURR) ×2 IMPLANT
BUR PRECISION FLUTE 6.0 (BURR) ×2 IMPLANT
CAGE ALTERA 10X31X9-13 15D (Cage) ×1 IMPLANT
CANISTER SUCT 3000ML PPV (MISCELLANEOUS) ×2 IMPLANT
CAP LOCK DLX THRD (Cap) ×6 IMPLANT
CARTRIDGE OIL MAESTRO DRILL (MISCELLANEOUS) ×1 IMPLANT
CNTNR URN SCR LID CUP LEK RST (MISCELLANEOUS) ×1 IMPLANT
CONT SPEC 4OZ STRL OR WHT (MISCELLANEOUS) ×2
COVER BACK TABLE 60X90IN (DRAPES) ×2 IMPLANT
DECANTER SPIKE VIAL GLASS SM (MISCELLANEOUS) ×2 IMPLANT
DIFFUSER DRILL AIR PNEUMATIC (MISCELLANEOUS) ×2 IMPLANT
DRAPE C-ARM 42X72 X-RAY (DRAPES) ×4 IMPLANT
DRAPE HALF SHEET 40X57 (DRAPES) ×4 IMPLANT
DRAPE LAPAROTOMY 100X72X124 (DRAPES) ×2 IMPLANT
DRAPE SURG 17X23 STRL (DRAPES) ×8 IMPLANT
DRSG OPSITE POSTOP 4X6 (GAUZE/BANDAGES/DRESSINGS) ×2 IMPLANT
DRSG OPSITE POSTOP 4X8 (GAUZE/BANDAGES/DRESSINGS) ×1 IMPLANT
ELECT BLADE 4.0 EZ CLEAN MEGAD (MISCELLANEOUS) ×2
ELECT REM PT RETURN 9FT ADLT (ELECTROSURGICAL) ×2
ELECTRODE BLDE 4.0 EZ CLN MEGD (MISCELLANEOUS) ×1 IMPLANT
ELECTRODE REM PT RTRN 9FT ADLT (ELECTROSURGICAL) ×1 IMPLANT
GAUZE 4X4 16PLY ~~LOC~~+RFID DBL (SPONGE) ×2 IMPLANT
GLOVE EXAM NITRILE XL STR (GLOVE) IMPLANT
GLOVE SURG ENC MOIS LTX SZ8 (GLOVE) ×4 IMPLANT
GLOVE SURG ENC MOIS LTX SZ8.5 (GLOVE) ×4 IMPLANT
GOWN STRL REUS W/ TWL LRG LVL3 (GOWN DISPOSABLE) IMPLANT
GOWN STRL REUS W/ TWL XL LVL3 (GOWN DISPOSABLE) ×2 IMPLANT
GOWN STRL REUS W/TWL 2XL LVL3 (GOWN DISPOSABLE) IMPLANT
GOWN STRL REUS W/TWL LRG LVL3 (GOWN DISPOSABLE)
GOWN STRL REUS W/TWL XL LVL3 (GOWN DISPOSABLE) ×4
HEMOSTAT POWDER KIT SURGIFOAM (HEMOSTASIS) ×3 IMPLANT
KIT BASIN OR (CUSTOM PROCEDURE TRAY) ×2 IMPLANT
KIT TURNOVER KIT B (KITS) ×2 IMPLANT
MILL MEDIUM DISP (BLADE) ×1 IMPLANT
NDL HYPO 21X1.5 SAFETY (NEEDLE) IMPLANT
NEEDLE HYPO 21X1.5 SAFETY (NEEDLE) ×2 IMPLANT
NEEDLE HYPO 22GX1.5 SAFETY (NEEDLE) ×2 IMPLANT
NS IRRIG 1000ML POUR BTL (IV SOLUTION) ×2 IMPLANT
OIL CARTRIDGE MAESTRO DRILL (MISCELLANEOUS) ×2
PACK LAMINECTOMY NEURO (CUSTOM PROCEDURE TRAY) ×2 IMPLANT
PAD ARMBOARD 7.5X6 YLW CONV (MISCELLANEOUS) ×6 IMPLANT
PATTIES SURGICAL .5 X1 (DISPOSABLE) IMPLANT
PATTIES SURGICAL 1X1 (DISPOSABLE) ×1 IMPLANT
PUTTY DBM 10CC CALC GRAN (Putty) ×2 IMPLANT
ROD CURVED TI 6.35X70 (Rod) ×2 IMPLANT
SCREW PA DLX CREO 7.5X35 (Screw) ×2 IMPLANT
SCREW PA DLX CREO 7.5X40 (Screw) ×4 IMPLANT
SCREW PA DLX CREO 7.5X45 (Screw) ×1 IMPLANT
SPACER ALTERA 10X31 8-12MM-8 (Spacer) ×1 IMPLANT
SPONGE NEURO XRAY DETECT 1X3 (DISPOSABLE) IMPLANT
SPONGE SURGIFOAM ABS GEL 100 (HEMOSTASIS) IMPLANT
SPONGE T-LAP 4X18 ~~LOC~~+RFID (SPONGE) ×3 IMPLANT
STRIP CLOSURE SKIN 1/2X4 (GAUZE/BANDAGES/DRESSINGS) ×2 IMPLANT
SUT VIC AB 1 CT1 18XBRD ANBCTR (SUTURE) ×2 IMPLANT
SUT VIC AB 1 CT1 8-18 (SUTURE) ×4
SUT VIC AB 2-0 CP2 18 (SUTURE) ×4 IMPLANT
SYR 20ML LL LF (SYRINGE) ×1 IMPLANT
TOWEL GREEN STERILE (TOWEL DISPOSABLE) ×2 IMPLANT
TOWEL GREEN STERILE FF (TOWEL DISPOSABLE) ×2 IMPLANT
TRAY FOLEY MTR SLVR 16FR STAT (SET/KITS/TRAYS/PACK) ×2 IMPLANT
WATER STERILE IRR 1000ML POUR (IV SOLUTION) ×2 IMPLANT

## 2021-02-23 NOTE — H&P (Signed)
Subjective: The patient is a 60 year old white female who has complained of back and right greater left leg pain consistent with lumbar radiculopathy/neurogenic claudication.  She has failed medical management and was worked up with a lumbar MRI and lumbar x-rays which demonstrated L4-5 and L5-S1 spondylolisthesis, facet arthropathy, spinal stenosis, etc.  I discussed the various treatment options with her.  She has decided proceed with surgery.  Past Medical History:  Diagnosis Date   ADD (attention deficit disorder)    Atrial tachycardia [facility])    s/p ablation by Dr Caryl Comes in 2005   Coronary artery disease, non-occlusive 06/2018   High coronary calcium score: Coronary CTA shows heavily calcified proximal LAD lesion with moderate stenosis (FFR 0.95 across lesion, distal LAD FFR 0.82 -- not likely physiologic significant).  Mild disease elsewhere.   DDD (degenerative disc disease), lumbar    Diverticulosis    Dyslipidemia    As of October 2019: LDL P 1564.  Goal less than 100.   Dysrhythmia    GERD (gastroesophageal reflux disease)    Internal hemorrhoids    Pneumonia    Sleep apnea    Social anxiety disorder    SVT (supraventricular tachycardia) (HCC)    Status post ablation by Dr. Rayann Heman in 2015    Past Surgical History:  Procedure Laterality Date   ABDOMINAL HYSTERECTOMY     ABLATION OF DYSRHYTHMIC FOCUS  2005   atrial tachycardia ablated by Dr Caryl Comes   ABLATION OF DYSRHYTHMIC FOCUS  08/26/2013   atrial tachycardia ablated by Dr Rayann Heman, focus located along the tricuspid valve annulus   ATRIAL FIBRILLATION ABLATION N/A 08/26/2013   Procedure: Tea;  Surgeon: Coralyn Mark, MD;  Location: [place] CATH LAB;  Service: Cardiovascular;  Laterality: N/A; ACTUALLY ATRIAL TACHYCARDIA   c-section  1997, 1988   x2   HEMORRHOID SURGERY  2002   PARTIAL HYSTERECTOMY  2000   TRANSTHORACIC ECHOCARDIOGRAM  07/2013   EF 55 to 60%.  GRII DD.  Mild AI, mild MR.   TUBAL  LIGATION      No Known Allergies  Social History   Tobacco Use   Smoking status: Never   Smokeless tobacco: Never  Substance Use Topics   Alcohol use: Yes    Alcohol/week: 2.0 standard drinks    Types: 2 Cans of beer per week    Comment: occasional wine    Family History  Problem Relation Age of Onset   Heart disease Father    Lung cancer Father    Hypertension Father    Coronary artery disease Other    CAD Mother 33       CABG   Peripheral vascular disease Mother    Dementia Mother    Hypertension Mother    Melanoma Sister    Heart attack Maternal Grandmother 48   CAD Maternal Grandmother 85       CABG at 67   Heart disease Maternal Aunt    Healthy Brother    Healthy Brother    Colon cancer Neg Hx    Prior to Admission medications   Medication Sig Start Date End Date Taking? Authorizing Provider  Alirocumab (PRALUENT) 150 MG/ML SOAJ Take 150 mg by mouth every 14 (fourteen) days. 06/10/19  Yes [provider]  aspirin EC 81 MG tablet Take 81 mg by mouth every evening. Swallow whole.   Yes [provider]  conjugated estrogens (PREMARIN) vaginal cream Place 1 Applicatorful vaginally 2 (two) times a week.   Yes [provider]  ezetimibe (ZETIA) 10 MG tablet Take 10 mg by mouth at bedtime.   Yes [provider]  FLUoxetine (PROZAC) 20 MG capsule Take 20 mg by mouth at bedtime. 02/03/20  Yes [provider]  metoprolol succinate (TOPROL-XL) 25 MG 24 hr tablet TAKE 1 TABLET BY MOUTH EVERY DAY Patient taking differently: Take 25 mg by mouth daily. 06/15/20  Yes Leonie Man, MD  Multiple Vitamin (MULTIVITAMIN WITH MINERALS) TABS tablet Take 1 tablet by mouth daily. Women's One-A-Day   Yes [provider]  niacin 500 MG tablet Take 500 mg by mouth at bedtime.   Yes [provider]  Omega-3 Fatty Acids (FISH OIL PO) Take 2 capsules by mouth at bedtime.   Yes [provider]  omeprazole (PRILOSEC) 40 MG  capsule Take 40 mg by mouth daily. 03/30/14  Yes [provider]  senna-docusate (SENOKOT-S) 8.6-50 MG tablet Take 8 tablets by mouth at bedtime.   Yes [provider]  simvastatin (ZOCOR) 40 MG tablet Take 40 mg by mouth at bedtime. 01/05/16  Yes [provider]  traMADol (ULTRAM) 50 MG tablet Take 50 mg by mouth every 12 (twelve) hours as needed (pain). 03/07/18  Yes [provider]  albuterol (VENTOLIN HFA) 108 (90 Base) MCG/ACT inhaler Inhale 1-2 puffs into the lungs every 6 (six) hours as needed for wheezing or shortness of breath. 10/10/19   [provider]  nitroGLYCERIN (NITROSTAT) 0.4 MG SL tablet Place 0.4 mg under the tongue every 5 (five) minutes as needed for chest pain.    [provider]  triamcinolone (KENALOG) 0.1 % paste Use as directed 1 application in the mouth or throat 2 (two) times daily as needed (sores).    [provider]     Review of Systems  Positive ROS: As above  All other systems have been reviewed and were otherwise negative with the exception of those mentioned in the HPI and as above.  Objective: Vital signs in last 24 hours: Temp:  [97.1 F (36.2 C)] 97.1 F (36.2 C) (07/27 0557) Pulse Rate:  [62] 62 (07/27 0557) Resp:  [20] 20 (07/27 0557) BP: (139)/(61) 139/61 (07/27 0557) SpO2:  [97 %] 97 % (07/27 0557) Weight:  [68.9 kg] 68.9 kg (07/27 0557) Estimated body mass index is 26.09 kg/m as calculated from the following:   Height as of this encounter: '5\' 4"'$  (1.626 m).   Weight as of this encounter: 68.9 kg.   General Appearance: Alert Head: Normocephalic, without obvious abnormality, atraumatic Eyes: PERRL, conjunctiva/corneas clear, EOM's intact,    Ears: Normal  Throat: Normal  Neck: Supple, Back: unremarkable Lungs: Clear to auscultation bilaterally, respirations unlabored Heart: Regular rate and rhythm, no murmur, rub or gallop Abdomen: Soft, non-tender Extremities: Extremities  normal, atraumatic, no cyanosis or edema Skin: unremarkable  NEUROLOGIC:   Mental status: alert and oriented,Motor Exam - grossly normal Sensory Exam - grossly normal Reflexes:  Coordination - grossly normal Gait - grossly normal Balance - grossly normal Cranial Nerves: I: smell Not tested  II: visual acuity  OS: Normal  OD: Normal   II: visual fields Full to confrontation  II: pupils Equal, round, reactive to light  III,VII: ptosis None  III,IV,VI: extraocular muscles  Full ROM  V: mastication Normal  V: facial light touch sensation  Normal  V,VII: corneal reflex  Present  VII: facial muscle function - upper  Normal  VII: facial muscle function - lower Normal  VIII: hearing Not tested  IX: soft palate elevation  Normal  IX,X: gag reflex Present  XI: trapezius strength  5/5  XI: sternocleidomastoid strength 5/5  XI: neck flexion strength  5/5  XII: tongue strength  Normal    Data Review Lab Results  Component Value Date   WBC 8.0 02/21/2021   HGB 14.1 02/21/2021   HCT 40.6 02/21/2021   MCV 85.7 02/21/2021   PLT 278 02/21/2021   Lab Results  Component Value Date   NA 139 02/21/2021   K 3.4 (L) 02/21/2021   CL 107 02/21/2021   CO2 26 02/21/2021   BUN 6 02/21/2021   CREATININE 0.78 02/21/2021   GLUCOSE 117 (H) 02/21/2021   No results found for: INR, PROTIME  Assessment/Plan: L4-5 and L5-S1 spondylolisthesis, spinal stenosis, lumbago, lumbar radiculopathy: I have discussed the situation with the patient.  We have discussed the various treatment options including surgery.  I have described the surgical treatment option of an L4-5 and L5-S1 decompression, instrumentation and fusion.  I have shown her surgical models.  I have given her a surgical pamphlet.  We have discussed the risk, benefits, alternatives, expected postoperative course, and likelihood of achieving our goals with surgery.  I have answered all her questions.  She has decided proceed with  surgery.   [name] 02/23/2021 7:18 AM

## 2021-02-23 NOTE — Transfer of Care (Signed)
Immediate Anesthesia Transfer of Care Note  Patient: Cathy Bruce  Procedure(s) Performed: POSTERIOR LUMBAR INTERBODY FUSION, INTERBODY PROSTHESIS, POSTERIOR INSTRUMENTATION, LUMBAR FOUR-LUMBAR FIVE, LUMBAR FIVE-SACRAL ONE  Patient Location: PACU  Anesthesia Type:General  Level of Consciousness: drowsy, patient cooperative and responds to stimulation  Airway & Oxygen Therapy: Patient Spontanous Breathing and Patient connected to face mask oxygen  Post-op Assessment: Report given to RN and Post -op Vital signs reviewed and stable  Post vital signs: Reviewed and stable  Last Vitals:  Vitals Value Taken Time  BP 99/48 02/23/21 1231  Temp    Pulse 100 02/23/21 1235  Resp 13 02/23/21 1235  SpO2 98 % 02/23/21 1235  Vitals shown include unvalidated device data.  Last Pain:  Vitals:   02/23/21 0557  TempSrc: Oral  PainSc: 0-No pain         Complications: No notable events documented.

## 2021-02-23 NOTE — Anesthesia Procedure Notes (Signed)
Procedure Name: Intubation Date/Time: 02/23/2021 7:40 AM Performed by: Cathren Harsh, CRNA Pre-anesthesia Checklist: Patient identified, Emergency Drugs available, Suction available and Patient being monitored Patient Re-evaluated:Patient Re-evaluated prior to induction Oxygen Delivery Method: Circle System Utilized Preoxygenation: Pre-oxygenation with 100% oxygen Induction Type: IV induction Ventilation: Mask ventilation without difficulty Laryngoscope Size: Mac and 3 Grade View: Grade III Tube type: Oral Tube size: 7.0 mm Number of attempts: 1 Airway Equipment and Method: Stylet and Oral airway Placement Confirmation: ETT inserted through vocal cords under direct vision, positive ETCO2 and breath sounds checked- equal and bilateral Secured at: 21 cm Tube secured with: Tape Dental Injury: Teeth and Oropharynx as per pre-operative assessment  Difficulty Due To: Difficult Airway- due to large tongue and Difficult Airway- due to anterior larynx Comments: Consider exaggerated "sniffing position" and having video laryngoscopy immediately available for future intubation attempts.

## 2021-02-23 NOTE — Op Note (Signed)
Brief history: The patient is a 60 year old white female who is complained of back and bilateral leg pain consistent with neurogenic claudication/lumbar radiculopathy.  She has failed medical management and was worked up with a lumbar MRI which demonstrated an L4-5 and L5-S1 spondylolisthesis and spinal stenosis.  I discussed the various treatment options with the patient he has decided proceed with surgery after weighing the risk, benefits and alternatives.  Preoperative diagnosis: L4-5 and L5-S1 degenerative disc disease, spinal stenosis compressing both the L4, L5 and S1nerve roots; lumbago; lumbar radiculopathy; neurogenic claudication  Postoperative diagnosis: The same  Procedure: Bilateral L4-5 and L5-S1 laminotomy/foraminotomies/medial facetectomy to decompress the bilateral L4, L5 and S1 nerve roots(the work required to do this was in addition to the work required to do the posterior lumbar interbody fusion because of the patient's spinal stenosis, facet arthropathy. Etc. requiring a wide decompression of the nerve roots.);  L4-5 and L5-S1 transforaminal lumbar interbody fusion with local morselized autograft bone and Zimmer DBM; insertion of interbody prosthesis at L4-5 and L5-S1 (globus peek expandable interbody prosthesis); posterior segmental instrumentation from L4 to S1 with globus titanium pedicle screws and rods; posterior lateral arthrodesis at L4-5 and L5-S1 with local morselized autograft bone and Zimmer DBM.  Surgeon: Dr. Earle Gell  Asst.: Arnetha Massy, NP  Anesthesia: Gen. endotracheal  Estimated blood loss: 200 cc  Drains: None  Complications: None  Description of procedure: The patient was brought to the operating room by the anesthesia team. General endotracheal anesthesia was induced. The patient was turned to the prone position on the Wilson frame. The patient's lumbosacral region was then prepared with Betadine scrub and Betadine solution. Sterile drapes were  applied.  I then injected the area to be incised with Marcaine with epinephrine solution. I then used the scalpel to make a linear midline incision over the L4-5 and L5-S1 interspace. I then used electrocautery to perform a bilateral subperiosteal dissection exposing the spinous process and lamina of L4, L5 and S1. We then obtained intraoperative radiograph to confirm our location. We then inserted the Verstrac retractor to provide exposure.  I began the decompression by using the high speed drill to perform laminotomies at L4-5 and L5-S1 bilaterally. We then used the Kerrison punches to widen the laminotomy and removed the ligamentum flavum at L4-5 and L5-S1 bilaterally. We used the Kerrison punches to remove the medial facets at L4-5 and L5-S1 bilaterally.  We removed the right L4-5 facet.. We performed wide foraminotomies about the bilateral L4, L5 and S1 nerve roots completing the decompression.  We now turned our attention to the posterior lumbar interbody fusion. I used a scalpel to incise the intervertebral disc at L4-5 and L5-S1 bilaterally. I then performed a partial intervertebral discectomy at L4-5 and L5-S1 bilaterally using the pituitary forceps. We prepared the vertebral endplates at 075-GRM and 075-GRM bilaterally for the fusion by removing the soft tissues with the curettes. We then used the trial spacers to pick the appropriate sized interbody prosthesis. We prefilled his prosthesis with a combination of local morselized autograft bone that we obtained during the decompression as well as Zimmer DBM. We inserted the prefilled prosthesis into the interspace at L4-5 and L5-S1 from the right, we then turned and expanded the prosthesis. There was a good snug fit of the prosthesis in the interspace. We then filled and the remainder of the intervertebral disc space with local morselized autograft bone and Zimmer DBM. This completed the posterior lumbar interbody arthrodesis.  During the decompression and  insertion of the prosthesis the assistant protected the thecal sac and nerve roots with the D'Errico retractor.  We now turned attention to the instrumentation. Under fluoroscopic guidance we cannulated the bilateral L4, L5 and S1 pedicles with the bone probe. We then removed the bone probe. We then tapped the pedicle with a 6.5 millimeter tap. We then removed the tap. We probed inside the tapped pedicle with a ball probe to rule out cortical breaches. We then inserted a 7.5 x 35 and 40 millimeter pedicle screw into the L4, L5 and S1 pedicles bilaterally under fluoroscopic guidance. We then palpated along the medial aspect of the pedicles to rule out cortical breaches. There were none. The nerve roots were not injured. We then connected the unilateral pedicle screws with a lordotic rod. We compressed the construct and secured the rod in place with the caps. We then tightened the caps appropriately. This completed the instrumentation from L4-S1 bilaterally.  We now turned our attention to the posterior lateral arthrodesis at L4-5 and L5-S1 bilaterally. We used the high-speed drill to decorticate the remainder of the facets, pars, transverse process at L4-5 and L5-S1 bilaterally. We then applied a combination of local morselized autograft bone and Zimmer DBM over these decorticated posterior lateral structures. This completed the posterior lateral arthrodesis.  We then obtained hemostasis using bipolar electrocautery. We irrigated the wound out with bacitracin solution. We inspected the thecal sac and nerve roots and noted they were well decompressed. We then removed the retractor.  We injected Exparel . We reapproximated patient's thoracolumbar fascia with interrupted #1 Vicryl suture. We reapproximated patient's subcutaneous tissue with interrupted 2-0 Vicryl suture. The reapproximated patient's skin with Steri-Strips and benzoin. The wound was then coated with bacitracin ointment. A sterile dressing was  applied. The drapes were removed. The patient was subsequently returned to the supine position where they were extubated by the anesthesia team. He was then transported to the post anesthesia care unit in stable condition. All sponge instrument and needle counts were reportedly correct at the end of this case.

## 2021-02-23 NOTE — Anesthesia Postprocedure Evaluation (Signed)
Anesthesia Post Note  Patient: Cathy Bruce  Procedure(s) Performed: POSTERIOR LUMBAR INTERBODY FUSION, INTERBODY PROSTHESIS, POSTERIOR INSTRUMENTATION, LUMBAR FOUR-LUMBAR FIVE, LUMBAR FIVE-SACRAL ONE     Patient location during evaluation: PACU Anesthesia Type: General Level of consciousness: awake and alert Pain management: pain level controlled Vital Signs Assessment: post-procedure vital signs reviewed and stable Respiratory status: spontaneous breathing, nonlabored ventilation, respiratory function stable and patient connected to nasal cannula oxygen Cardiovascular status: blood pressure returned to baseline and stable Postop Assessment: no apparent nausea or vomiting Anesthetic complications: no   No notable events documented.  Last Vitals:  Vitals:   02/23/21 1430 02/23/21 1451  BP: 122/62 118/66  Pulse: 93 85  Resp: 19 20  Temp: 37 C 36.5 C  SpO2: 97% 95%            Effie Berkshire

## 2021-02-23 NOTE — Progress Notes (Signed)
Orthopedic Tech Progress Note Patient Details:  Cathy Bruce 25-Sep-1960 [id number]  Ortho Devices Type of Ortho Device: Lumbar corsett Ortho Device/Splint Interventions: Ordered      Danton Sewer A Austin Pongratz 02/23/2021, 3:39 PM

## 2021-02-24 LAB — BASIC METABOLIC PANEL
Anion gap: 8 (ref 5–15)
BUN: 7 mg/dL (ref 6–20)
CO2: 27 mmol/L (ref 22–32)
Calcium: 8.5 mg/dL — ABNORMAL LOW (ref 8.9–10.3)
Chloride: 102 mmol/L (ref 98–111)
Creatinine, Ser: 0.8 mg/dL (ref 0.44–1.00)
GFR, Estimated: >60 mL/min (ref >60)
Glucose, Bld: 117 mg/dL — ABNORMAL HIGH (ref 70–99)
Potassium: 3.7 mmol/L (ref 3.5–5.1)
Sodium: 137 mmol/L (ref 135–145)

## 2021-02-24 LAB — CBC
HCT: 30.6 % — ABNORMAL LOW (ref 36.0–46.0)
Hemoglobin: 10.6 g/dL — ABNORMAL LOW (ref 12.0–15.0)
MCH: 30.5 pg (ref 26.0–34.0)
MCHC: 34.6 g/dL (ref 30.0–36.0)
MCV: 88.2 fL (ref 80.0–100.0)
Platelets: 199 10*3/uL (ref 150–400)
RBC: 3.47 MIL/uL — ABNORMAL LOW (ref 3.87–5.11)
RDW: 13.5 % (ref 11.5–15.5)
WBC: 11.6 10*3/uL — ABNORMAL HIGH (ref 4.0–10.5)
nRBC: 0 % (ref 0.0–0.2)

## 2021-02-24 MED ORDER — ONDANSETRON HCL 4 MG PO TABS: 4.0000 mg | ORAL_TABLET | Freq: Four times a day (QID) | ORAL | 0 refills | Status: DC | PRN

## 2021-02-24 MED ORDER — DOCUSATE SODIUM 100 MG PO CAPS: 100.0000 mg | ORAL_CAPSULE | Freq: Two times a day (BID) | ORAL | 0 refills | Status: DC

## 2021-02-24 MED ORDER — HYDROXYZINE HCL 50 MG/ML IM SOLN
50.0000 mg | Freq: Four times a day (QID) | INTRAMUSCULAR | Status: DC | PRN
  Administered 2021-02-24: 50 mg via INTRAMUSCULAR
  Filled 2021-02-24: qty 1

## 2021-02-24 MED ORDER — OXYCODONE HCL 10 MG PO TABS: 10.0000 mg | ORAL_TABLET | ORAL | 0 refills | Status: DC | PRN

## 2021-02-24 MED ORDER — METOPROLOL SUCCINATE ER 25 MG PO TB24: 25.0000 mg | ORAL_TABLET | Freq: Every day | ORAL | Status: DC

## 2021-02-24 MED FILL — Heparin Sodium (Porcine) Inj 1000 Unit/ML: INTRAMUSCULAR | Qty: 30 | Status: AC

## 2021-02-24 MED FILL — Thrombin For Soln 5000 Unit: CUTANEOUS | Qty: 5000 | Status: AC

## 2021-02-24 MED FILL — Sodium Chloride IV Soln 0.9%: INTRAVENOUS | Qty: 1000 | Status: AC

## 2021-02-24 NOTE — Progress Notes (Addendum)
02/24/21 1321  PT Visit Information  Last PT Received On 02/24/21  Assistance Needed +1  History of Present Illness Pt is a 60 y/o female who presents s/p L3-S1 PLIF on 02/23/2021. PMH significant for ADD, CAD, atrial tachycardia, degenerative joint disease, diverticulosis, SVT, social anxiety, sleep apnea, and dysrhythmia.  Subjective Data  Patient Stated Goal go home  Precautions  Precautions Back;Fall  Precaution Booklet Issued Yes (comment)  Precaution Comments Required cues to recall back precautions.  Required Braces or Orthoses Spinal Brace  Spinal Brace Lumbar corset;Applied in sitting position  Restrictions  Weight Bearing Restrictions No  Pain Assessment  Pain Assessment Faces  Faces Pain Scale 6  Pain Location Incision site  Pain Descriptors / Indicators Operative site guarding;Sore  Pain Intervention(s) Limited activity within patient's tolerance;Monitored during session;Repositioned  Cognition  Arousal/Alertness Awake/alert  Behavior During Therapy WFL for tasks assessed/performed  Overall Cognitive Status Within Functional Limits for tasks assessed  Bed Mobility  Overal bed mobility Needs Assistance  Bed Mobility Rolling;Sidelying to Sit;Sit to Sidelying  Rolling Supervision  Sidelying to sit Supervision  Sit to sidelying Supervision  General bed mobility comments VC's for log roll technique. Supervision for safety  Transfers  Overall transfer level Needs assistance  Equipment used None  Transfers Sit to/from Stand  Sit to Stand Supervision  General transfer comment VC's for hand placement on seated surface for safety and for optimal posture. Generally flexed with transfers due to pain.  Ambulation/Gait  Ambulation/Gait assistance Min guard  Gait Distance (Feet) 50 Feet  Assistive device None  Gait Pattern/deviations Shuffle;Trunk flexed;Narrow base of support  General Gait Details Slow and guarded, gait. Able to ambulate to stairwell and back to room. Once  in room, pt requesting to ambulate to restroom and while in restroom, pt became nauseated and dizzy and required return to bed. Notified RN. Min guard for safety.  Gait velocity Decreased  Stairs Yes  Stairs assistance Min guard  Stair Management Two rails;Step to pattern;Forwards  Number of Stairs 3  General stair comments Slow guarded navigation of steps. No overt LOB noted. Cues for LE sequencing.  Balance  Overall balance assessment Needs assistance  Sitting-balance support No upper extremity supported;Feet supported  Sitting balance-Leahy Scale Fair  Standing balance support No upper extremity supported;During functional activity  Standing balance-Leahy Scale Fair  PT - End of Session  Equipment Utilized During Treatment Back brace  Activity Tolerance Other (comment) (Limited by nausea)  Patient left in bed;with call bell/phone within reach;with family/visitor present  Nurse Communication Mobility status;Other (comment) (Nauseated)   PT - Assessment/Plan  PT Plan Current plan remains appropriate  PT Visit Diagnosis Unsteadiness on feet (R26.81);Pain  Pain - part of body  (back)  PT Frequency (ACUTE ONLY) Min 5X/week  Follow Up Recommendations No PT follow up;Supervision for mobility/OOB  PT equipment None recommended by PT  AM-PAC PT "6 Clicks" Mobility Outcome Measure (Version 2)  Help needed turning from your back to your side while in a flat bed without using bedrails? 4  Help needed moving from lying on your back to sitting on the side of a flat bed without using bedrails? 3  Help needed moving to and from a bed to a chair (including a wheelchair)? 3  Help needed standing up from a chair using your arms (e.g., wheelchair or bedside chair)? 3  Help needed to walk in hospital room? 3  Help needed climbing 3-5 steps with a railing?  3  6 Click Score 19  Consider Recommendation of Discharge To: Home with [facility]  Progressive Mobility  What is the highest level of mobility based  on the progressive mobility assessment? Level 5 (Walks with assist in room/hall) - Balance while stepping forward/back and can walk in room with assist - Complete  Mobility Ambulated with assistance in hallway  PT Goal Progression  Progress towards PT goals Progressing toward goals  Acute Rehab PT Goals  PT Goal Formulation With patient/family  Time For Goal Achievement 03/03/21  Potential to Achieve Goals Good  PT Time Calculation  PT Start Time (ACUTE ONLY) 1257  PT Stop Time (ACUTE ONLY) 1311  PT Time Calculation (min) (ACUTE ONLY) 14 min  PT General Charges  $$ ACUTE PT VISIT 1 Visit  PT Treatments  $Gait Training 8-22 mins    Pt progressing towards goals. Was able to practice stair navigation with min guard A. However, after returning to room and using bathroom, pt became nauseous and dizzy and had to return to bed. RN notified. Reviewed back precautions and generalized walking program with pt and pt's husband. Current recommendations appropriate and anticipate pt will progress well once symptoms improve. Will continue to follow acutely.   Reuel Derby, PT, DPT  Acute Rehabilitation Services  Pager: [phone] Office: [phone]

## 2021-02-24 NOTE — Evaluation (Signed)
Physical Therapy Evaluation Patient Details Name: Cathy Bruce MRN: [id number] DOB: [birth year] Today's Date: 02/24/2021   History of Present Illness  Pt is a 60 y/o female who presents s/p L3-S1 PLIF on 02/23/2021. PMH significant for ADD, CAD, atrial tachycardia, degenerative joint disease, diverticulosis, SVT, social anxiety, sleep apnea, and dysrhythmia.   Clinical Impression  Pt admitted with above diagnosis. At the time of PT eval, pt was able to demonstrate transfers and minimal ambulation around the room with up to min guard assist. Pt significantly limited by nausea and pain at this time and we weren't able to progress mobility enough for hallway ambulation or stair training. Pt was educated on precautions, brace application/wearing schedule, appropriate activity progression, and car transfer. Brace adjusted for optimal fit. Pt currently with functional limitations due to the deficits listed below (see PT Problem List). Pt will benefit from skilled PT to increase their independence and safety with mobility to allow discharge to the venue listed below.      Follow Up Recommendations No PT follow up;Supervision for mobility/OOB    Equipment Recommendations  None recommended by PT    Recommendations for Other Services       Precautions / Restrictions Precautions Precautions: Back;Fall Precaution Booklet Issued: Yes (comment) Precaution Comments: Reviewed precautions during functional mobility. Required Braces or Orthoses: Spinal Brace Spinal Brace: Lumbar corset;Applied in sitting position Restrictions Weight Bearing Restrictions: No      Mobility  Bed Mobility Overal bed mobility: Needs Assistance Bed Mobility: Rolling;Sidelying to Sit;Sit to Sidelying Rolling: Supervision Sidelying to sit: Supervision     Sit to sidelying: Supervision General bed mobility comments: VC's for optimal log roll technique. HOB flat and rails lowered to simulate home environment.     Transfers Overall transfer level: Needs assistance Equipment used: None Transfers: Sit to/from Stand Sit to Stand: Supervision         General transfer comment: VC's for hand placement on seated surface for safety and for optimal posture. Generally flexed with transfers due to pain.  Ambulation/Gait Ambulation/Gait assistance: Min guard Gait Distance (Feet): 5 Feet Assistive device: None Gait Pattern/deviations: Shuffle;Trunk flexed;Narrow base of support Gait velocity: Decreased Gait velocity interpretation: <1.31 ft/sec, indicative of household ambulator General Gait Details: Slow and guarded, shuffling around area by edge of bed. Became nauseated with standing and required urgent return to supine.  Stairs            Wheelchair Mobility    Modified Rankin (Stroke Patients Only)       Balance Overall balance assessment: Needs assistance Sitting-balance support: No upper extremity supported;Feet supported Sitting balance-Leahy Scale: Fair     Standing balance support: No upper extremity supported;During functional activity Standing balance-Leahy Scale: Fair Standing balance comment: Reaching out for wall and high guard position with UE's for balance.                             Pertinent Vitals/Pain Pain Assessment: Faces Faces Pain Scale: Hurts little more Pain Location: Incision site Pain Descriptors / Indicators: Operative site guarding;Sore Pain Intervention(s): Limited activity within patient's tolerance;Monitored during session;Repositioned    Home Living Family/patient expects to be discharged to:: Private residence Living Arrangements: Spouse/significant other Available Help at Discharge: Family Type of Home: House Home Access: Stairs to enter Entrance Stairs-Rails: Right;Left;Can reach both Entrance Stairs-Number of Steps: 4-5 Home Layout: Two level Home Equipment: Shower seat;Adaptive equipment;Other (comment) (stair chair)       Prior  Function Level of Independence: Independent               Hand Dominance        Extremity/Trunk Assessment   Upper Extremity Assessment Upper Extremity Assessment: Defer to OT evaluation    Lower Extremity Assessment Lower Extremity Assessment: Generalized weakness    Cervical / Trunk Assessment Cervical / Trunk Assessment: Other exceptions Cervical / Trunk Exceptions: s/p Lumbar spinal surgery  Communication   Communication: No difficulties  Cognition Arousal/Alertness: Awake/alert Behavior During Therapy: WFL for tasks assessed/performed Overall Cognitive Status: Within Functional Limits for tasks assessed                                        General Comments      Exercises     Assessment/Plan    PT Assessment Patient needs continued PT services  PT Problem List Decreased strength;Decreased activity tolerance;Decreased balance;Decreased mobility;Decreased knowledge of use of DME;Decreased safety awareness;Decreased knowledge of precautions;Pain       PT Treatment Interventions DME instruction;Gait training;Stair training;Functional mobility training;Therapeutic activities;Therapeutic exercise;Neuromuscular re-education;Patient/family education    PT Goals (Current goals can be found in the Care Plan section)  Acute Rehab PT Goals Patient Stated Goal: go home PT Goal Formulation: With patient/family Time For Goal Achievement: 03/03/21 Potential to Achieve Goals: Good    Frequency Min 5X/week   Barriers to discharge        Co-evaluation               AM-PAC PT "6 Clicks" Mobility  Outcome Measure Help needed turning from your back to your side while in a flat bed without using bedrails?: None Help needed moving from lying on your back to sitting on the side of a flat bed without using bedrails?: A Little Help needed moving to and from a bed to a chair (including a wheelchair)?: A Little Help needed standing up from a  chair using your arms (e.g., wheelchair or bedside chair)?: A Little Help needed to walk in hospital room?: A Little Help needed climbing 3-5 steps with a railing? : A Little 6 Click Score: 19    End of Session Equipment Utilized During Treatment: Back brace Activity Tolerance: Other (comment) (Limited by nausea) Patient left: in bed;with call bell/phone within reach;with family/visitor present Nurse Communication: Mobility status;Other (comment) (Nauseated) PT Visit Diagnosis: Unsteadiness on feet (R26.81);Pain Pain - part of body:  (back)    Time: [id number] PT Time Calculation (min) (ACUTE ONLY): 16 min   Charges:   PT Evaluation $PT Eval Low Complexity: 1 Low          Rolinda Roan, PT, DPT Acute Rehabilitation Services Pager: [phone] Office: [phone]   Thelma Comp 02/24/2021, 12:56 PM

## 2021-02-24 NOTE — Evaluation (Signed)
Occupational Therapy Evaluation Patient Details Name: Cathy Bruce MRN: [id number] DOB: 04-Sep-1960 Today's Date: 02/24/2021    History of Present Illness 60 y.o female s/p PLIF L3-S1 with interbody prosthesis. PMH significant for ADD, CAD, atrial tachycardia, degenerative joint disease, diverticulosis, SVT, social anxiety, sleep apnea, and dysrhythmia.   Clinical Impression   PTA, pt was independent and lived with her husband. Pt performing LB ADLs and functional mobility with supervision for safety. Pt educated and demonstrating use of compensatory techniques for LB dressing, brace application, shower transfers, bed mobility, and toileting. All questions answered. Recommend discharge home with no follow up OT. Re-consult if change in status. OT to sign off.     Follow Up Recommendations  No OT follow up    Equipment Recommendations  None recommended by OT    Recommendations for Other Services       Precautions / Restrictions Precautions Precautions: Back Precaution Booklet Issued: Yes (comment) Precaution Comments: All education reviewed, pt verbalizing understanding. Required Braces or Orthoses: Spinal Brace Spinal Brace: Lumbar corset Restrictions Weight Bearing Restrictions: No      Mobility Bed Mobility Overal bed mobility: Needs Assistance Bed Mobility: Rolling;Sidelying to Sit;Sit to Sidelying Rolling: Supervision Sidelying to sit: Min guard     Sit to sidelying: Min guard General bed mobility comments: Pt performing bed mobility using log roll technique with supervision for safety.    Transfers Overall transfer level: Needs assistance Equipment used: None Transfers: Sit to/from Stand Sit to Stand: Supervision         General transfer comment: Supervision for safety    Balance Overall balance assessment: Needs assistance Sitting-balance support: No upper extremity supported;Feet supported Sitting balance-Leahy Scale: Good Sitting balance -  Comments: donning LB dressing sitting EOB   Standing balance support: No upper extremity supported;During functional activity Standing balance-Leahy Scale: Good Standing balance comment: Performing walk in shower transfer with supervision for safety                           ADL either performed or assessed with clinical judgement   ADL Overall ADL's : Needs assistance/impaired Eating/Feeding: Set up;Sitting   Grooming: Supervision/safety;Standing   Upper Body Bathing: Set up;Sitting   Lower Body Bathing: Supervison/ safety;Sit to/from stand   Upper Body Dressing : Set up;Sitting   Lower Body Dressing: Supervision/safety;Sit to/from stand Lower Body Dressing Details (indicate cue type and reason): Pt educated on use of sock aid and reacher for LB dressing Toilet Transfer: Sales executive;Ambulation   Toileting- Clothing Manipulation and Hygiene: Supervision/safety;Sit to/from stand   Tub/ Shower Transfer: Walk-in shower;Supervision/safety;Ambulation;Shower seat   Functional mobility during ADLs: Supervision/safety General ADL Comments: Pt performing LB ADLs and functional mobility with supervision for safety. Pt educated and demonstrating use of compensatory techniques for LB dressing, brace application, shower transfers, bed mobility, and toileting.     Vision Baseline Vision/History: Wears glasses Wears Glasses: At all times Vision Assessment?: No apparent visual deficits     Perception Perception Perception Tested?: No Comments: no apparent difficulty with perception   Praxis Praxis Praxis tested?: Not tested Praxis-Other Comments: no apparent difficulty with motor planning.    Pertinent Vitals/Pain Pain Assessment: Faces Faces Pain Scale: Hurts little more Pain Location: operative site Pain Descriptors / Indicators: Discomfort;Operative site guarding Pain Intervention(s): Limited activity within patient's tolerance;Monitored during  session;Repositioned     Hand Dominance     Extremity/Trunk Assessment Upper Extremity Assessment Upper Extremity Assessment: Generalized weakness   Lower  Extremity Assessment Lower Extremity Assessment: Defer to PT evaluation   Cervical / Trunk Assessment Cervical / Trunk Assessment: Other exceptions Cervical / Trunk Exceptions: s/p Lumbar spinal surgery   Communication Communication Communication: No difficulties   Cognition Arousal/Alertness: Awake/alert Behavior During Therapy: WFL for tasks assessed/performed Overall Cognitive Status: Within Functional Limits for tasks assessed                                 General Comments: pt pleasant and conversational throughout session. Verbalizing understaning of all education provided.   General Comments  Pt verbalizing understanding of all education provided    Exercises     Shoulder Instructions      Home Living Family/patient expects to be discharged to:: Private residence Living Arrangements: Spouse/significant other Available Help at Discharge: Family Type of Home: House Home Access: Stairs to enter Technical brewer of Steps: 4-5 Entrance Stairs-Rails: Right;Left;Can reach both Home Layout: Two level Alternate Level Stairs-Number of Steps: flight; has stair chair lift   Bathroom Shower/Tub: Occupational psychologist: Standard     Home Equipment: Shower seat;Adaptive equipment;Other (comment) (stair chair) Adaptive Equipment: Reacher Additional Comments: pt reporting husband available to assist as needed.      Prior Functioning/Environment Level of Independence: Independent                 OT Problem List: Decreased strength;Decreased activity tolerance;Impaired balance (sitting and/or standing);Decreased knowledge of precautions;Decreased knowledge of use of DME or AE      OT Treatment/Interventions:      OT Goals(Current goals can be found in the care plan section)  Acute Rehab OT Goals Patient Stated Goal: go home OT Goal Formulation: With patient  OT Frequency:     Barriers to D/C:            Co-evaluation              AM-PAC OT "6 Clicks" Daily Activity     Outcome Measure Help from another person eating meals?: A Little Help from another person taking care of personal grooming?: A Little Help from another person toileting, which includes using toliet, bedpan, or urinal?: A Little Help from another person bathing (including washing, rinsing, drying)?: A Little Help from another person to put on and taking off regular upper body clothing?: A Little Help from another person to put on and taking off regular lower body clothing?: A Little 6 Click Score: 18   End of Session Equipment Utilized During Treatment: Gait belt;Back brace Nurse Communication: Mobility status  Activity Tolerance: Patient tolerated treatment well Patient left: in bed;with call bell/phone within reach  OT Visit Diagnosis: Unsteadiness on feet (R26.81);Muscle weakness (generalized) (M62.81);Pain Pain - part of body:  (back)                Time: [id number] OT Time Calculation (min): 22 min Charges:  OT General Charges $OT Visit: 1 Visit OT Evaluation $OT Eval Low Complexity: [place], OTDS  Shanda Howells 02/24/2021, 8:19 AM

## 2021-02-24 NOTE — Discharge Instructions (Addendum)
Restart fish oil on post op day 5.  Wound Care Keep incision covered and dry for two days.    Do not put any creams, lotions, or ointments on incision. Leave steri-strips on back.  They will fall off by themselves.  Activity Walk each and every day, increasing distance each day. No lifting greater than 5 lbs. No driving for 2 weeks; may ride as a passenger locally.  Diet Resume your normal diet.   Return to Work Will be discussed at your follow up appointment.  Call Your Doctor If Any of These Occur Redness, drainage, or swelling at the wound.  Temperature greater than 101 degrees. Severe pain not relieved by pain medication. Incision starts to come apart.  Follow Up Appt Call today for appointment in 1-2 weeks [phone]) or for problems.  If you have any hardware placed in your spine, you will need an x-ray before your appointment.

## 2021-02-24 NOTE — Discharge Summary (Addendum)
Physician Discharge Summary     Providing Compassionate, Quality Care - Together   Patient ID: Cathy Bruce MRN: [id number] DOB/AGE: March 09, 1961 60 y.o.  Admit date: 02/23/2021 Discharge date: 02/24/2021  Admission Diagnoses: Spondylolisthesis of lumbar region  Discharge Diagnoses:  Active Problems:   Spondylolisthesis, lumbar region   Discharged Condition: good  Hospital Course: Patient underwent an L4-5, L5-S1 posterior lumbar fusion by Dr. Arnoldo Morale on 02/23/2021. She was admitted to 3C10 following recovery from anesthesia in the PACU. Her postoperative course has been uncomplicated. She has worked with both physical and occupational therapies who feel the patient is ready for discharge home. She is ambulating independently and without difficulty. She is tolerating a normal diet. She is not having any bowel or bladder dysfunction. Her pain is well-controlled with oral pain medication. She is ready for discharge home.   Consults: None  Significant Diagnostic Studies: radiology: DG Lumbar Spine 2-3 Views  Result Date: 02/23/2021 CLINICAL DATA:  Surgery, elective Z41.9 (ICD-10-CM). Additional history provided by technologist: Posterior lumbar interbody fusion, interbody prosthesis, posterior instrumentation, lumbar 4-lumbar 5, lumbar 5-sacral 1. Provided fluoroscopy time 0 minutes, 43 seconds (31.88 mGy). EXAM: LUMBAR SPINE - 2-3 VIEW; DG C-ARM 1-60 MIN COMPARISON:  Intraoperative localizer radiographs of the lumbar spine performed earlier today 02/23/2021. Lumbar spine MRI 01/13/2021. FINDINGS: AP and lateral view intraoperative fluoroscopic images of the lumbosacral spine are submitted, 2 images total. The lowest well-formed intervertebral disc space is designated L5-S1. On the provided images, bilateral pedicle screws are present at the L4, L5 and S1 levels. Vertical interconnecting rods were not present at the time the images were taken. Interbody devices are present at L4-L5 and  L5-S1. Overlying retractors. IMPRESSION: Two intraoperative fluoroscopic images of the lumbosacral spine from reported L4-S1 posterior fusion, as described. Electronically Signed   By: Kellie Simmering DO   On: 02/23/2021 12:13   DG Lumbar Spine 1 View  Result Date: 02/23/2021 CLINICAL DATA:  Lumbar spine surgery. EXAM: LUMBAR SPINE - 1 VIEW COMPARISON:  MRI 04/24/2018. FINDINGS: Lumbar spine numbered as per prior MRI. Metallic marker noted posteriorly at the level of L5-S1. A sponge is noted posteriorly. Mild anterolisthesis L4 on L5 again noted. IMPRESSION: Metallic marker noted posteriorly at the level of L5-S1. Electronically Signed   By: Marcello Moores  Register   On: 02/23/2021 11:06   DG C-Arm 1-60 Min  Result Date: 02/23/2021 CLINICAL DATA:  Surgery, elective Z41.9 (ICD-10-CM). Additional history provided by technologist: Posterior lumbar interbody fusion, interbody prosthesis, posterior instrumentation, lumbar 4-lumbar 5, lumbar 5-sacral 1. Provided fluoroscopy time 0 minutes, 43 seconds (31.88 mGy). EXAM: LUMBAR SPINE - 2-3 VIEW; DG C-ARM 1-60 MIN COMPARISON:  Intraoperative localizer radiographs of the lumbar spine performed earlier today 02/23/2021. Lumbar spine MRI 01/13/2021. FINDINGS: AP and lateral view intraoperative fluoroscopic images of the lumbosacral spine are submitted, 2 images total. The lowest well-formed intervertebral disc space is designated L5-S1. On the provided images, bilateral pedicle screws are present at the L4, L5 and S1 levels. Vertical interconnecting rods were not present at the time the images were taken. Interbody devices are present at L4-L5 and L5-S1. Overlying retractors. IMPRESSION: Two intraoperative fluoroscopic images of the lumbosacral spine from reported L4-S1 posterior fusion, as described. Electronically Signed   By: Kellie Simmering DO   On: 02/23/2021 12:13   DG OR LOCAL ABDOMEN  Result Date: 02/23/2021 CLINICAL DATA:  Broken surgical instrument. Reportedly, all of  the pieces were removed. EXAM: OR LOCAL ABDOMEN COMPARISON:  Fluoroscopic images 7 02/16/2021  FINDINGS: Single view of the lower abdomen was obtained. Bilateral pedicle screw and rod fixation at L4, L5 and S1 with interbody devices. No unexpected radiopaque foreign bodies. IMPRESSION: No unexpected radiopaque foreign bodies. Report was called to OR 20 at 12:29 p.m. on 02/23/2021. Electronically Signed   By: Markus Daft M.D.   On: 02/23/2021 12:31     Treatments: surgery:  Bilateral L4-5 and L5-S1 laminotomy/foraminotomies/medial facetectomy to decompress the bilateral L4, L5 and S1 nerve roots(the work required to do this was in addition to the work required to do the posterior lumbar interbody fusion because of the patient's spinal stenosis, facet arthropathy. Etc. requiring a wide decompression of the nerve roots.);  L4-5 and L5-S1 transforaminal lumbar interbody fusion with local morselized autograft bone and Zimmer DBM; insertion of interbody prosthesis at L4-5 and L5-S1 (globus peek expandable interbody prosthesis); posterior segmental instrumentation from L4 to S1 with globus titanium pedicle screws and rods; posterior lateral arthrodesis at L4-5 and L5-S1 with local morselized autograft bone and Zimmer DBM.  Discharge Exam: Blood pressure (!) 104/53, pulse 67, temperature 98.2 F (36.8 C), temperature source Oral, resp. rate 16, height '5\' 4"'$  (1.626 m), weight 68.9 kg, SpO2 99 %.  Per report: Alert and oriented x 4 PERRLA CN II-XII grossly intact MAE, Strength and sensation intact Incision is covered with Honeycomb dressing and Steri Strips; Dressing is clean, dry, and intact   Disposition: Discharge disposition: 01-Home or Self Care        Allergies as of 02/24/2021   No Known Allergies      Medication List     STOP taking these medications    traMADol 50 MG tablet Commonly known as: ULTRAM       TAKE these medications    albuterol 108 (90 Base) MCG/ACT  inhaler Commonly known as: VENTOLIN HFA Inhale 1-2 puffs into the lungs every 6 (six) hours as needed for wheezing or shortness of breath.   aspirin EC 81 MG tablet Take 81 mg by mouth every evening. Swallow whole.   docusate sodium 100 MG capsule Commonly known as: COLACE Take 1 capsule (100 mg total) by mouth 2 (two) times daily.   ezetimibe 10 MG tablet Commonly known as: ZETIA Take 10 mg by mouth at bedtime.   FISH OIL PO Take 2 capsules by mouth at bedtime.   FLUoxetine 20 MG capsule Commonly known as: PROZAC Take 20 mg by mouth at bedtime.   metoprolol succinate 25 MG 24 hr tablet Commonly known as: TOPROL-XL Take 1 tablet (25 mg total) by mouth daily.   multivitamin with minerals Tabs tablet Take 1 tablet by mouth daily. Women's One-A-Day   niacin 500 MG tablet Take 500 mg by mouth at bedtime.   nitroGLYCERIN 0.4 MG SL tablet Commonly known as: NITROSTAT Place 0.4 mg under the tongue every 5 (five) minutes as needed for chest pain.   omeprazole 40 MG capsule Commonly known as: PRILOSEC Take 40 mg by mouth daily.   ondansetron 4 MG tablet Commonly known as: ZOFRAN Take 1 tablet (4 mg total) by mouth every 6 (six) hours as needed for nausea or vomiting.   Oxycodone HCl 10 MG Tabs Take 1 tablet (10 mg total) by mouth every 4 (four) hours as needed for severe pain ((score 7 to 10)).   Praluent 150 MG/ML Soaj Generic drug: Alirocumab Take 150 mg by mouth every 14 (fourteen) days.   Premarin vaginal cream Generic drug: conjugated estrogens Place 1 Applicatorful vaginally 2 (two) times  a week.   senna-docusate 8.6-50 MG tablet Commonly known as: Senokot-S Take 8 tablets by mouth at bedtime.   simvastatin 40 MG tablet Commonly known as: ZOCOR Take 40 mg by mouth at bedtime.   triamcinolone 0.1 % paste Commonly known as: KENALOG Use as directed 1 application in the mouth or throat 2 (two) times daily as needed (sores).        Follow-up Information      Newman Pies, MD. Schedule an appointment as soon as possible for a visit in 3 week(s).   Specialty: Neurosurgery Contact information: [address] [place] [address] [phone]                 Signed: Viona Gilmore, DNP, AGNP-C Nurse Practitioner  [facility] [place] [address], [place], [place] [address] P: [phone]    F: [phone]  02/24/2021, 10:20 AM

## 2021-02-25 MED ORDER — OXYCODONE-ACETAMINOPHEN 5-325 MG PO TABS
1.0000 | ORAL_TABLET | ORAL | Status: DC | PRN
Start: 2021-02-25 — End: 2021-02-26
  Administered 2021-02-25 – 2021-02-26 (×2): 2 via ORAL
  Filled 2021-02-25 (×2): qty 2

## 2021-02-25 MED ORDER — KETOROLAC TROMETHAMINE 30 MG/ML IJ SOLN
30.0000 mg | Freq: Four times a day (QID) | INTRAMUSCULAR | Status: DC
  Administered 2021-02-25 – 2021-02-26 (×3): 30 mg via INTRAVENOUS
  Filled 2021-02-25 (×3): qty 1

## 2021-02-25 MED ORDER — METHOCARBAMOL 500 MG PO TABS
500.0000 mg | ORAL_TABLET | Freq: Four times a day (QID) | ORAL | Status: DC | PRN
  Administered 2021-02-25 – 2021-02-26 (×3): 500 mg via ORAL
  Filled 2021-02-25 (×3): qty 1

## 2021-02-25 NOTE — TOC Transition Note (Signed)
Transition of Care [facility]) - CM/SW Discharge Note   Patient Details  Name: Cathy Bruce MRN: [id number] Date of Birth: [birth year]  [facility] CM/SW Contact:  Pollie Friar, RN Phone Number: 02/25/2021, 8:44 AM   Clinical Narrative:    Patient is discharging home with self care. No f/u per PT/OT and no DME needs.  Pt has transportation home.   Final next level of care: Home/Self Care Barriers to Discharge: No Barriers Identified   Patient Goals and CMS Choice        Discharge Placement                       Discharge Plan and Services                                     Social Determinants of Health (SDOH) Interventions     Readmission Risk Interventions No flowsheet data found.

## 2021-02-25 NOTE — Progress Notes (Signed)
Patient noted to void very little amount in the bathroom after ambulating in hall and c/o discomfort in the abdomen. Bladder scan showed 528 mL, and In & out cath outputted 700 mL which patient tolerated well the procedure and felt very much relieved.  Will closely monitor.

## 2021-02-25 NOTE — Plan of Care (Signed)
  Problem: Education: Goal: Ability to verbalize activity precautions or restrictions will improve Outcome: Progressing Goal: Understanding of discharge needs will improve Outcome: Progressing   Problem: Activity: Goal: Ability to tolerate increased activity will improve Outcome: Progressing Goal: Will remain free from falls Outcome: Progressing

## 2021-02-25 NOTE — Progress Notes (Signed)
Subjective: The patient is alert and pleasant.  She feels much more sore today.  Her nausea is better.  Objective: Vital signs in last 24 hours: Temp:  [97.5 F (36.4 C)-100.2 F (37.9 C)] 98.4 F (36.9 C) (07/29 0503) Pulse Rate:  [67-105] 102 (07/29 0503) Resp:  [16-18] 18 (07/29 0503) BP: (104-129)/(53-67) 126/67 (07/29 0503) SpO2:  [93 %-100 %] 93 % (07/29 0503) Estimated body mass index is 26.09 kg/m as calculated from the following:   Height as of this encounter: '5\' 4"'$  (1.626 m).   Weight as of this encounter: 68.9 kg.   Intake/Output from previous day: No intake/output data recorded. Intake/Output this shift: No intake/output data recorded.  Physical exam the patient is alert and pleasant.  She is moving her lower extremities well.  Lab Results: Recent Labs    02/24/21 0419  WBC 11.6*  HGB 10.6*  HCT 30.6*  PLT 199   BMET Recent Labs    02/24/21 0419  NA 137  K 3.7  CL 102  CO2 27  GLUCOSE 117*  BUN 7  CREATININE 0.80  CALCIUM 8.5*    Studies/Results: DG Lumbar Spine 2-3 Views  Result Date: 02/23/2021 CLINICAL DATA:  Surgery, elective Z41.9 (ICD-10-CM). Additional history provided by technologist: Posterior lumbar interbody fusion, interbody prosthesis, posterior instrumentation, lumbar 4-lumbar 5, lumbar 5-sacral 1. Provided fluoroscopy time 0 minutes, 43 seconds (31.88 mGy). EXAM: LUMBAR SPINE - 2-3 VIEW; DG C-ARM 1-60 MIN COMPARISON:  Intraoperative localizer radiographs of the lumbar spine performed earlier today 02/23/2021. Lumbar spine MRI 01/13/2021. FINDINGS: AP and lateral view intraoperative fluoroscopic images of the lumbosacral spine are submitted, 2 images total. The lowest well-formed intervertebral disc space is designated L5-S1. On the provided images, bilateral pedicle screws are present at the L4, L5 and S1 levels. Vertical interconnecting rods were not present at the time the images were taken. Interbody devices are present at L4-L5 and  L5-S1. Overlying retractors. IMPRESSION: Two intraoperative fluoroscopic images of the lumbosacral spine from reported L4-S1 posterior fusion, as described. Electronically Signed   By: Kellie Simmering DO   On: 02/23/2021 12:13   DG Lumbar Spine 1 View  Result Date: 02/23/2021 CLINICAL DATA:  Lumbar spine surgery. EXAM: LUMBAR SPINE - 1 VIEW COMPARISON:  MRI 04/24/2018. FINDINGS: Lumbar spine numbered as per prior MRI. Metallic marker noted posteriorly at the level of L5-S1. A sponge is noted posteriorly. Mild anterolisthesis L4 on L5 again noted. IMPRESSION: Metallic marker noted posteriorly at the level of L5-S1. Electronically Signed   By: Marcello Moores  Register   On: 02/23/2021 11:06   DG C-Arm 1-60 Min  Result Date: 02/23/2021 CLINICAL DATA:  Surgery, elective Z41.9 (ICD-10-CM). Additional history provided by technologist: Posterior lumbar interbody fusion, interbody prosthesis, posterior instrumentation, lumbar 4-lumbar 5, lumbar 5-sacral 1. Provided fluoroscopy time 0 minutes, 43 seconds (31.88 mGy). EXAM: LUMBAR SPINE - 2-3 VIEW; DG C-ARM 1-60 MIN COMPARISON:  Intraoperative localizer radiographs of the lumbar spine performed earlier today 02/23/2021. Lumbar spine MRI 01/13/2021. FINDINGS: AP and lateral view intraoperative fluoroscopic images of the lumbosacral spine are submitted, 2 images total. The lowest well-formed intervertebral disc space is designated L5-S1. On the provided images, bilateral pedicle screws are present at the L4, L5 and S1 levels. Vertical interconnecting rods were not present at the time the images were taken. Interbody devices are present at L4-L5 and L5-S1. Overlying retractors. IMPRESSION: Two intraoperative fluoroscopic images of the lumbosacral spine from reported L4-S1 posterior fusion, as described. Electronically Signed   By: Marylyn Ishihara  Golden DO   On: 02/23/2021 12:13   DG OR LOCAL ABDOMEN  Result Date: 02/23/2021 CLINICAL DATA:  Broken surgical instrument. Reportedly, all of  the pieces were removed. EXAM: OR LOCAL ABDOMEN COMPARISON:  Fluoroscopic images 7 02/16/2021 FINDINGS: Single view of the lower abdomen was obtained. Bilateral pedicle screw and rod fixation at L4, L5 and S1 with interbody devices. No unexpected radiopaque foreign bodies. IMPRESSION: No unexpected radiopaque foreign bodies. Report was called to OR 20 at 12:29 p.m. on 02/23/2021. Electronically Signed   By: Markus Daft M.D.   On: 02/23/2021 12:31    Assessment/Plan: Postop day #2: We will continue to mobilize the patient with PT and OT.  She may go home later today.  I have answered all her questions.  LOS: 2 days     [name] 02/25/2021, 6:42 AM     Patient ID: Cathy Bruce, female   DOB: 04-Dec-1960, 60 y.o.   MRN: [id number]

## 2021-02-25 NOTE — Progress Notes (Signed)
Physical Therapy Treatment Patient Details Name: Cathy Bruce MRN: [id number] DOB: 07-16-1961 Today's Date: 02/25/2021    History of Present Illness Pt is a 60 y/o female who presents s/p L3-S1 PLIF on 02/23/2021. PMH significant for ADD, CAD, atrial tachycardia, degenerative joint disease, diverticulosis, SVT, social anxiety, sleep apnea, and dysrhythmia.    PT Comments    Pt progressing slowly towards physical therapy goals. Pt limited by pain this session, requiring several rest breaks throughout session. Pt was received attempting to ambulate with husband to the bathroom from the recliner. Pt got 1/2 way and required a rest break sitting EOB. BSC brought in to assist. Discussed functional progress with pt and husband, and recommend another day in the hospital to work with therapy and manage pain control. Will continue to follow and progress as able per POC.     Follow Up Recommendations  No PT follow up;Supervision for mobility/OOB     Equipment Recommendations  3in1 (PT)    Recommendations for Other Services       Precautions / Restrictions Precautions Precautions: Back;Fall Precaution Booklet Issued: Yes (comment) Precaution Comments: Required cues to recall back precautions. Required Braces or Orthoses: Spinal Brace Spinal Brace: Lumbar corset;Applied in sitting position Restrictions Weight Bearing Restrictions: No    Mobility  Bed Mobility Overal bed mobility: Needs Assistance Bed Mobility: Rolling;Sit to Sidelying Rolling: Min guard       Sit to sidelying: Max assist;+2 for physical assistance General bed mobility comments: Pt unable to manage log roll due to increased pain. +2 assist for Trunk management and LE elevation back up into bed at the end of the session.    Transfers Overall transfer level: Needs assistance Equipment used: None Transfers: Sit to/from Stand Sit to Stand: Min assist         General transfer comment: Assist for power-up to  full stand. VC's for hand placement on seated surface for safety.  Ambulation/Gait Ambulation/Gait assistance: Min guard Gait Distance (Feet): 50 Feet Assistive device: Rolling walker (2 wheeled) Gait Pattern/deviations: Shuffle;Trunk flexed;Narrow base of support Gait velocity: Decreased Gait velocity interpretation: <1.31 ft/sec, indicative of household ambulator General Gait Details: Very slow and guarded with short, shuffling steps. Pt was able to mildly correct step/stride length but unable to maintain.   Stairs             Wheelchair Mobility    Modified Rankin (Stroke Patients Only)       Balance Overall balance assessment: Needs assistance Sitting-balance support: No upper extremity supported;Feet supported Sitting balance-Leahy Scale: Fair     Standing balance support: Single extremity supported;During functional activity Standing balance-Leahy Scale: Poor Standing balance comment: Required at least 1 hand on RW for support while performing peri-care s/p BSC use.                            Cognition Arousal/Alertness: Awake/alert Behavior During Therapy: WFL for tasks assessed/performed Overall Cognitive Status: Within Functional Limits for tasks assessed                                        Exercises      General Comments        Pertinent Vitals/Pain Pain Assessment: Faces Faces Pain Scale: Hurts even more Pain Location: Incision site Pain Descriptors / Indicators: Operative site guarding;Sore Pain Intervention(s): Limited activity within patient's tolerance;Monitored  during session;Repositioned    Home Living                      Prior Function            PT Goals (current goals can now be found in the care plan section) Acute Rehab PT Goals Patient Stated Goal: go home PT Goal Formulation: With patient/family Time For Goal Achievement: 03/03/21 Potential to Achieve Goals: Good Progress towards PT  goals: Progressing toward goals    Frequency    Min 5X/week      PT Plan Current plan remains appropriate    Co-evaluation              AM-PAC PT "6 Clicks" Mobility   Outcome Measure  Help needed turning from your back to your side while in a flat bed without using bedrails?: None Help needed moving from lying on your back to sitting on the side of a flat bed without using bedrails?: A Lot Help needed moving to and from a bed to a chair (including a wheelchair)?: A Little Help needed standing up from a chair using your arms (e.g., wheelchair or bedside chair)?: A Little Help needed to walk in hospital room?: A Little Help needed climbing 3-5 steps with a railing? : A Lot 6 Click Score: 17    End of Session Equipment Utilized During Treatment: Back brace Activity Tolerance: Patient limited by pain;Patient limited by fatigue Patient left: in bed;with call bell/phone within reach;with family/visitor present Nurse Communication: Mobility status;Patient requests pain meds PT Visit Diagnosis: Unsteadiness on feet (R26.81);Pain Pain - part of body:  (back)     Time: [id number] PT Time Calculation (min) (ACUTE ONLY): 44 min  Charges:  $Gait Training: 38-52 mins                     Rolinda Roan, PT, DPT Acute Rehabilitation Services Pager: [phone] Office: [phone]    Thelma Comp 02/25/2021, 1:22 PM

## 2021-02-26 MED ORDER — DOCUSATE SODIUM 100 MG PO CAPS: 100.0000 mg | ORAL_CAPSULE | Freq: Two times a day (BID) | ORAL | 2 refills | Status: AC

## 2021-02-26 MED ORDER — ONDANSETRON HCL 4 MG PO TABS: 4.0000 mg | ORAL_TABLET | Freq: Four times a day (QID) | ORAL | 0 refills | Status: DC | PRN

## 2021-02-26 MED ORDER — METHYLPREDNISOLONE 4 MG PO TBPK: ORAL_TABLET | ORAL | 0 refills | Status: DC

## 2021-02-26 MED ORDER — METHOCARBAMOL 500 MG PO TABS
500.0000 mg | ORAL_TABLET | Freq: Four times a day (QID) | ORAL | 1 refills | Status: DC | PRN
Start: 2021-02-26 — End: 2021-02-26

## 2021-02-26 MED ORDER — OXYCODONE-ACETAMINOPHEN 5-325 MG PO TABS: 1.0000 | ORAL_TABLET | ORAL | 0 refills | Status: DC | PRN

## 2021-02-26 MED ORDER — METHOCARBAMOL 500 MG PO TABS: 500.0000 mg | ORAL_TABLET | Freq: Four times a day (QID) | ORAL | 0 refills | Status: DC | PRN

## 2021-02-26 NOTE — Progress Notes (Signed)
Patient was transported via wheelchair by NT for discharge home; in no acute distress nor complaints of pain nor discomfort; room was checked and accounted for all her belongings; discharge instructions given to patient and her husband by RN and both verbalized understanding on the instructions given.

## 2021-02-26 NOTE — Progress Notes (Signed)
Physical Therapy Treatment Patient Details Name: Cathy Bruce MRN: [id number] DOB: 05-Dec-1960 Today's Date: 02/26/2021    History of Present Illness Pt is a 60 y/o female who presents s/p L3-S1 PLIF on 02/23/2021. PMH significant for ADD, CAD, atrial tachycardia, degenerative joint disease, diverticulosis, SVT, social anxiety, sleep apnea, and dysrhythmia.    PT Comments    Pt progressing with post-op mobility however still very painful overall. She was able to demonstrate transfers and ambulation with gross min guard assist to min assist and RW for support. BSC was required for increased height as pt unable to lower completely down to the standard commode. She reported extreme LLE pain and was unable to full sit down. Pt repositioned in bed and ice applied. Pt was educated on precautions, brace application/wearing schedule, appropriate activity progression, and car transfer. Will continue to follow.      Follow Up Recommendations  No PT follow up;Supervision for mobility/OOB     Equipment Recommendations  3in1 (PT)    Recommendations for Other Services       Precautions / Restrictions Precautions Precautions: Back;Fall Precaution Booklet Issued: Yes (comment) Precaution Comments: Required cues to recall back precautions. Required Braces or Orthoses: Spinal Brace Spinal Brace: Lumbar corset;Applied in sitting position Restrictions Weight Bearing Restrictions: No    Mobility  Bed Mobility Overal bed mobility: Needs Assistance Bed Mobility: Rolling;Sit to Sidelying Rolling: Min assist Sidelying to sit: Min guard     Sit to sidelying: Min assist General bed mobility comments: Increased time and pt utilizing breathing techniques to transition to/from EOB due to pain. HOB flat and rails lowered to simulate home environment.    Transfers Overall transfer level: Needs assistance Equipment used: None Transfers: Sit to/from Stand Sit to Stand: Min guard;From elevated  surface         General transfer comment: Hands-on guarding as pt powered up to full stand. VC's for hand placement on seated surface for safety. Pt unable to lower to a standard commode or low bed height due to LLE pain.  Ambulation/Gait Ambulation/Gait assistance: Min guard Gait Distance (Feet): 50 Feet Assistive device: Rolling walker (2 wheeled) Gait Pattern/deviations: Shuffle;Trunk flexed;Narrow base of support;Step-to pattern Gait velocity: Decreased Gait velocity interpretation: <1.31 ft/sec, indicative of household ambulator General Gait Details: Very slow and guarded with short, shuffling steps. Pt was able to mildly correct step/stride length but unable to maintain. Gait speed slightly improved from yesterday's session.   Stairs Stairs: Yes Stairs assistance: Min guard;Min assist Stair Management: Two rails;Step to pattern;Forwards Number of Stairs: 2 General stair comments: Limited by pain. Pt was able to negotiate 2 stairs but unable to progress further. VC's throughout for sequencing and general safety.   Wheelchair Mobility    Modified Rankin (Stroke Patients Only)       Balance Overall balance assessment: Needs assistance Sitting-balance support: No upper extremity supported;Feet supported Sitting balance-Leahy Scale: Fair     Standing balance support: Single extremity supported;During functional activity Standing balance-Leahy Scale: Poor Standing balance comment: Required at least 1 hand on RW for support                            Cognition Arousal/Alertness: Awake/alert Behavior During Therapy: WFL for tasks assessed/performed Overall Cognitive Status: Within Functional Limits for tasks assessed  Exercises      General Comments        Pertinent Vitals/Pain Pain Assessment: Faces Faces Pain Scale: Hurts worst Pain Location: Incision site, LLE Pain Descriptors / Indicators:  Operative site guarding;Sore;Cramping Pain Intervention(s): Monitored during session;Limited activity within patient's tolerance;Repositioned;Ice applied;Utilized relaxation techniques;Patient requesting pain meds-RN notified    Home Living                      Prior Function            PT Goals (current goals can now be found in the care plan section) Acute Rehab PT Goals Patient Stated Goal: go home PT Goal Formulation: With patient/family Time For Goal Achievement: 03/03/21 Potential to Achieve Goals: Good Progress towards PT goals: Progressing toward goals    Frequency    Min 5X/week      PT Plan Current plan remains appropriate    Co-evaluation              AM-PAC PT "6 Clicks" Mobility   Outcome Measure  Help needed turning from your back to your side while in a flat bed without using bedrails?: None Help needed moving from lying on your back to sitting on the side of a flat bed without using bedrails?: A Little Help needed moving to and from a bed to a chair (including a wheelchair)?: A Little Help needed standing up from a chair using your arms (e.g., wheelchair or bedside chair)?: A Little Help needed to walk in hospital room?: A Little Help needed climbing 3-5 steps with a railing? : A Little 6 Click Score: 19    End of Session Equipment Utilized During Treatment: Back brace Activity Tolerance: Patient limited by pain;Patient limited by fatigue Patient left: in bed;with call bell/phone within reach;with family/visitor present Nurse Communication: Mobility status;Patient requests pain meds PT Visit Diagnosis: Unsteadiness on feet (R26.81);Pain Pain - part of body:  (back)     Time: 0732-0825 PT Time Calculation (min) (ACUTE ONLY): 53 min  Charges:  $Gait Training: 53-67 mins                     Rolinda Roan, PT, DPT Acute Rehabilitation Services Pager: [phone] Office: [phone]    Thelma Comp 02/26/2021, 8:51 AM

## 2021-02-26 NOTE — Discharge Summary (Signed)
Physician Discharge Summary  Patient ID: Cathy Bruce MRN: [id number] DOB/AGE: July 24, 1961 60 y.o.  Admit date: 02/23/2021 Discharge date: 02/26/2021  Admission Diagnoses:  L4-5, L5-S1 spondylolisthesis and stenosis  Discharge Diagnoses:  Same Active Problems:   Spondylolisthesis, lumbar region   Discharged Condition: Stable  Hospital Course:  Cathy Bruce is a 60 y.o. female presented for an L4-S1 lumbar fusion and decompression.  She tolerated the surgery well on 02/23/2021.  Postoperatively she was evaluated by PT OT and felt that home health care was adequate.  Her pain was controlled on oral medication upon discharge.  She was having normal bowel bladder function.  She was at her neurologic baseline.  Her wound was clean dry and intact.  Treatments: Surgery -L4-S1 open lumbar fusion and decompression  Discharge Exam: Blood pressure (!) 119/43, pulse 95, temperature 97.6 F (36.4 C), temperature source Oral, resp. rate 16, height '5\' 4"'$  (1.626 m), weight 68.9 kg, SpO2 99 %. Awake, alert, oriented x3 PERRLA Speech fluent, appropriate CN grossly intact 5/5 BUE 4+/5 BLE Wound c/d/I Sensory intact light touch  Disposition: Discharge disposition: 06-Home-Health Care Svc       Discharge Instructions     Face-to-face encounter (required for Medicare/Medicaid patients)   Complete by: As directed    I Patricia Nettle certify that this patient is under my care and that I, or a nurse practitioner or physician's assistant working with me, had a face-to-face encounter that meets the physician face-to-face encounter requirements with this patient on 02/25/2021. The encounter with the patient was in whole, or in part for the following medical condition(s) which is the primary reason for home health care (List medical condition): S/p posterior lumbar fusion   The encounter with the patient was in whole, or in part, for the following medical condition, which is the primary  reason for home health care: S/p posterior lumbar fusion   I certify that, based on my findings, the following services are medically necessary home health services: Physical therapy   Reason for Medically Necessary Home Health Services: Therapy- Therapeutic Exercises to Increase Strength and Endurance   My clinical findings support the need for the above services: Unable to leave home safely without assistance and/or assistive device   Further, I certify that my clinical findings support that this patient is homebound due to: Unable to leave home safely without assistance   Home Health   Complete by: As directed    To provide the following care/treatments:  PT OT        Allergies as of 02/26/2021   No Known Allergies      Medication List     STOP taking these medications    traMADol 50 MG tablet Commonly known as: ULTRAM       TAKE these medications    albuterol 108 (90 Base) MCG/ACT inhaler Commonly known as: VENTOLIN HFA Inhale 1-2 puffs into the lungs every 6 (six) hours as needed for wheezing or shortness of breath.   aspirin EC 81 MG tablet Take 81 mg by mouth every evening. Swallow whole.   docusate sodium 100 MG capsule Commonly known as: COLACE Take 1 capsule (100 mg total) by mouth 2 (two) times daily.   ezetimibe 10 MG tablet Commonly known as: ZETIA Take 10 mg by mouth at bedtime.   FISH OIL PO Take 2 capsules by mouth at bedtime.   FLUoxetine 20 MG capsule Commonly known as: PROZAC Take 20 mg by mouth at bedtime.   methocarbamol  500 MG tablet Commonly known as: ROBAXIN Take 1 tablet (500 mg total) by mouth every 6 (six) hours as needed for muscle spasms.   methylPREDNISolone 4 MG Tbpk tablet Commonly known as: MEDROL DOSEPAK Araly Kaas, DO   metoprolol succinate 25 MG 24 hr tablet Commonly known as: TOPROL-XL Take 1 tablet (25 mg total) by mouth daily.   multivitamin with minerals Tabs tablet Take 1 tablet by mouth daily. Women's  One-A-Day   niacin 500 MG tablet Take 500 mg by mouth at bedtime.   nitroGLYCERIN 0.4 MG SL tablet Commonly known as: NITROSTAT Place 0.4 mg under the tongue every 5 (five) minutes as needed for chest pain.   omeprazole 40 MG capsule Commonly known as: PRILOSEC Take 40 mg by mouth daily.   ondansetron 4 MG tablet Commonly known as: ZOFRAN Take 1 tablet (4 mg total) by mouth every 6 (six) hours as needed for nausea or vomiting.   oxyCODONE-acetaminophen 5-325 MG tablet Commonly known as: PERCOCET/ROXICET Take 1-2 tablets by mouth every 4 (four) hours as needed for severe pain.   Praluent 150 MG/ML Soaj Generic drug: Alirocumab Take 150 mg by mouth every 14 (fourteen) days.   Premarin vaginal cream Generic drug: conjugated estrogens Place 1 Applicatorful vaginally 2 (two) times a week.   senna-docusate 8.6-50 MG tablet Commonly known as: Senokot-S Take 8 tablets by mouth at bedtime.   simvastatin 40 MG tablet Commonly known as: ZOCOR Take 40 mg by mouth at bedtime.   triamcinolone 0.1 % paste Commonly known as: KENALOG Use as directed 1 application in the mouth or throat 2 (two) times daily as needed (sores).               Durable Medical Equipment  (From admission, onward)           Start     Ordered   02/26/21 0844  For home use only DME 3 n 1  Once        02/26/21 [id number]            Follow-up Information     Newman Pies, MD. Schedule an appointment as soon as possible for a visit in 3 week(s).   Specialty: Neurosurgery Contact information: [address] [place] [address] [phone]         Care, [facility] Follow up.   Specialty: Home Health Services Why: The home health agency will contact you for the first home visit. Contact information: [address] [place] [address] [phone]                 Signed: Theodoro Doing Jarquez Mestre 02/26/2021, 8:48 AM

## 2021-04-04 ENCOUNTER — Encounter: Payer: Self-pay | Admitting: Internal Medicine

## 2021-04-13 ENCOUNTER — Encounter: Payer: Self-pay | Admitting: Internal Medicine

## 2021-05-11 ENCOUNTER — Telehealth: Payer: Self-pay | Admitting: *Deleted

## 2021-05-11 NOTE — Telephone Encounter (Signed)
John,  Please review. [employer] for [employer]? Thanks, Shaughnessy Gethers pv

## 2021-05-12 DIAGNOSIS — T884XXA Failed or difficult intubation, initial encounter: Secondary | ICD-10-CM | POA: Insufficient documentation

## 2021-05-12 NOTE — Telephone Encounter (Signed)
Dr Hilarie Fredrickson,  This pt has a documented difficult intubation  And per Jenny Reichmann needs a [facility] case- do you want her to have an OV or Direct at [facility]  Thanks- Lelan Pons PV

## 2021-05-16 ENCOUNTER — Other Ambulatory Visit: Payer: Self-pay | Admitting: Internal Medicine

## 2021-05-16 DIAGNOSIS — E785 Hyperlipidemia, unspecified: Secondary | ICD-10-CM

## 2021-05-17 ENCOUNTER — Other Ambulatory Visit: Payer: Self-pay | Admitting: Internal Medicine

## 2021-05-17 DIAGNOSIS — E041 Nontoxic single thyroid nodule: Secondary | ICD-10-CM

## 2021-05-18 ENCOUNTER — Telehealth: Payer: Self-pay | Admitting: Neurology

## 2021-05-18 NOTE — Telephone Encounter (Signed)
LVM & sent pt mychart message requesting call back to schedule initial cpap, needs done between 06/17/21-08/15/21.

## 2021-05-18 NOTE — Telephone Encounter (Signed)
Pt has called back and has been scheduled for her initial CPAP f/u 08-15-21 pt aware to bing CPAP & power cord.  This is FYI no call back requested.

## 2021-05-19 NOTE — Telephone Encounter (Signed)
[place]ron for direct hospital procedure with me or next available [facility] provider given hx of difficult intubation

## 2021-05-24 ENCOUNTER — Other Ambulatory Visit: Payer: Self-pay

## 2021-05-24 DIAGNOSIS — Z1211 Encounter for screening for malignant neoplasm of colon: Secondary | ICD-10-CM

## 2021-05-24 NOTE — Telephone Encounter (Signed)
Pt scheduled for colon at [facility] 06/13/21 at 9:30am.

## 2021-05-24 NOTE — Telephone Encounter (Signed)
Called pt and informed of move to [facility] , date time and reason of difficult intubation

## 2021-05-25 NOTE — Telephone Encounter (Signed)
Spoke with pt and let her know if she cannot do 11/14 for the procedure she will have to wait until January to have procedure. She wants to wait to Jan. WIll call pt back when schedule is available.

## 2021-05-25 NOTE — Telephone Encounter (Signed)
Inbound call from patient. States she is unable to have procedure 11/14. She is able the end of that week or in December.

## 2021-06-08 ENCOUNTER — Other Ambulatory Visit: Payer: 59

## 2021-06-10 ENCOUNTER — Encounter: Payer: 59 | Admitting: Internal Medicine

## 2021-06-13 ENCOUNTER — Encounter (HOSPITAL_COMMUNITY): Payer: Self-pay

## 2021-06-13 ENCOUNTER — Ambulatory Visit (HOSPITAL_COMMUNITY): Admit: 2021-06-13 | Payer: 59 | Admitting: Internal Medicine

## 2021-06-13 ENCOUNTER — Telehealth: Payer: Self-pay | Admitting: Internal Medicine

## 2021-06-13 SURGERY — COLONOSCOPY WITH PROPOFOL
Anesthesia: Monitor Anesthesia Care

## 2021-06-13 NOTE — Telephone Encounter (Signed)
Patient wants to go ahead and schedule colon at hospital.  Wants to do it sooner rather than later.  Please call with date & time.  Thank you.

## 2021-06-13 NOTE — Telephone Encounter (Signed)
See my chart message

## 2021-06-14 ENCOUNTER — Ambulatory Visit
Admission: RE | Admit: 2021-06-14 | Discharge: 2021-06-14 | Disposition: A | Discharge status: Home / Self Care | Payer: No Typology Code available for payment source | Source: Ambulatory Visit | Attending: Internal Medicine | Admitting: Internal Medicine

## 2021-06-14 ENCOUNTER — Ambulatory Visit
Admission: RE | Admit: 2021-06-14 | Discharge: 2021-06-14 | Disposition: A | Discharge status: Home / Self Care | Payer: 59 | Source: Ambulatory Visit | Attending: Internal Medicine | Admitting: Internal Medicine

## 2021-06-14 DIAGNOSIS — E785 Hyperlipidemia, unspecified: Secondary | ICD-10-CM

## 2021-06-14 DIAGNOSIS — E041 Nontoxic single thyroid nodule: Secondary | ICD-10-CM

## 2021-06-21 ENCOUNTER — Other Ambulatory Visit: Payer: Self-pay

## 2021-06-21 DIAGNOSIS — Z1211 Encounter for screening for malignant neoplasm of colon: Secondary | ICD-10-CM

## 2021-06-21 NOTE — Telephone Encounter (Signed)
Scheduled pt for colonoscopy and previsit. Colonoscopy scheduled for 08/16/21 at 9:30am, pt instructed to arrive at [place] scheduled for 07/06/21 at 3:30pm. Called patient to let her know.

## 2021-06-27 ENCOUNTER — Other Ambulatory Visit: Payer: Self-pay | Admitting: Cardiology

## 2021-06-30 ENCOUNTER — Other Ambulatory Visit: Payer: Self-pay | Admitting: Internal Medicine

## 2021-06-30 DIAGNOSIS — E785 Hyperlipidemia, unspecified: Secondary | ICD-10-CM

## 2021-07-05 ENCOUNTER — Encounter: Payer: Self-pay | Admitting: Internal Medicine

## 2021-07-06 ENCOUNTER — Ambulatory Visit (AMBULATORY_SURGERY_CENTER): Payer: 59 | Admitting: *Deleted

## 2021-07-06 ENCOUNTER — Other Ambulatory Visit: Payer: Self-pay

## 2021-07-06 VITALS — Ht 64.0 in | Wt 152.0 lb

## 2021-07-06 DIAGNOSIS — Z86010 Personal history of colonic polyps: Secondary | ICD-10-CM

## 2021-07-06 MED ORDER — NA SULFATE-K SULFATE-MG SULF 17.5-3.13-1.6 GM/177ML PO SOLN: 1.0000 | ORAL | 0 refills | Status: DC

## 2021-07-06 NOTE — Progress Notes (Signed)
Patient's pre-visit was done today over the phone with the patient. Name,DOB and address verified. Patient denies any allergies to Eggs and Soy. Patient is a difficult intubation. Patient is not taking any diet pills or blood thinners. No home Oxygen. Packet of Prep instructions mailed to patient -pt is aware. Patient understands to call us back with any questions or concerns. Patient is aware of our care-partner policy and [id number] safety protocol.   EMMI education assigned to the patient for the procedure, sent to [place].   The patient is COVID-19 vaccinated.

## 2021-08-05 ENCOUNTER — Encounter (HOSPITAL_COMMUNITY): Payer: Self-pay | Admitting: Gastroenterology

## 2021-08-05 NOTE — Progress Notes (Signed)
Attempted to obtain medical history via telephone, unable to reach at this time. I left a voicemail to return pre surgical testing department's phone call.  

## 2021-08-09 ENCOUNTER — Telehealth: Payer: Self-pay | Admitting: Gastroenterology

## 2021-08-09 ENCOUNTER — Other Ambulatory Visit: Payer: Self-pay

## 2021-08-09 NOTE — Progress Notes (Signed)
[facility] [address] [place]. [place] [address] [phone]       OFFICE FOLLOW UP NOTE  [name] Date of Birth:  26-Oct-1960 Medical Record Number:  [id number]   Primary neurologist: Cathy. Brett Bruce Reason for visit: Initial CPAP follow-up    SUBJECTIVE:   CHIEF COMPLAINT:  Chief Complaint  Patient presents with   Obstructive Sleep Apnea    Rm 3 alone  Pt is well, no concerns with CPAP.     HPI:   Update 08/09/2021 JM: returns for initial CPAP compliance visit. Followed by Cathy. Brett Bruce.  Completed HST 09/2020 which showed severe sleep apnea with AHI 54.9/h and recommended initiating AutoPap. Review of compliance report shows excellent usage with optimal residual AHI. Doing well on CPAP - tolerates without difficulty. Reports improvement of day time fatigue and not taking daily daytime naps. Followed by [facility]. ESS 8. FSS 25.          Copied from Cathy. Edwena Bruce initial consult visit 06/15/2020 Cathy Bruce is a 61  year old Caucasian female patient and seen on 06/15/2020 upon CONSULTATION requested by Cathy. Joylene Bruce.  Chief concern  :  When sleeping on her back she has struggled to breathe, felt palpitations, and her mouth trembling. Known history of bruxism, and uses a mouth guard.  = Cathy Bruce presents with a possible sleep disorder. She  has a  medical history of ADD (attention deficit disorder), Atrial tachycardia ([place]), Coronary artery disease, non-occlusive (06/2018), DDD (degenerative disc disease), lumbar, Diverticulosis, Dyslipidemia, GERD (gastroesophageal reflux disease), Internal hemorrhoids, Social anxiety disorder, and SVT (supraventricular tachycardia) ([place]) , had 2 ablations with Cathy Bruce.     Sleep relevant medical history: Nocturia 2-4 times, dream enactment, new- nasal rhinitis, bruxism and sinusitis.    Family medical /sleep history: mother  with OSA,  mother has dementia,  Social  history:  Patient is retired and takes care of her grandchildren( 3 of them)  and lives in a household with spouse, no pets, mother moved to assisted living. 2 adult children. Tobacco use- none.  ETOH use ; rarely - less than 2 /month. Caffeine intake in form of Soda( 3/ day)  and no energy drinks. Regular exercise in form of walking.      Sleep habits are as follows: The patient's dinner time is between 6-6.30 PM. The patient goes to bed at 12 PM- 1 AM,  And sometimes has trouble to fall asleep- once asleep she  continues to sleep for intervals of 2-6 hours , wakes for bathroom breaks.   The preferred sleep position is on her side , with the support of 4 pillows. Bed is not adjusted. Back pain- pillows between her legs.  Dreams are reportedly frequent/vivid- sometimes acting out, yelling .  9-11 AM is the usual rise time. The patient wakes up spontaneously - 'not a morning person". .  She reports most mornings  feeling refreshed and  restored in AM, with symptoms such as dry mouth- and eyes, and residual fatigue.Naps are taken frequently, lasting from 45-65 minutes on her couch, on her back- "breathing cutting off'  -these  are less refreshing than nocturnal sleep.         ROS:   14 system review of systems performed and negative with exception of no complaints     PMH:  Past Medical History:  Diagnosis Date   ADD (attention deficit disorder)    Atrial tachycardia ([place])    s/p  ablation by Cathy Bruce in 2005   Coronary artery disease, non-occlusive 06/2018   High coronary calcium score: Coronary CTA shows heavily calcified proximal LAD lesion with moderate stenosis (FFR 0.95 across lesion, distal LAD FFR 0.82 -- not likely physiologic significant).  Mild disease elsewhere.   DDD (degenerative disc disease), lumbar    Difficult intubation    Diverticulosis    Dyslipidemia    As of October 2019: LDL P 1564.  Goal less than 100.   Dysrhythmia    GERD (gastroesophageal reflux disease)     Internal hemorrhoids    Pneumonia    Sleep apnea    uses CPAP   Social anxiety disorder    SVT (supraventricular tachycardia) ([facility])    Status post ablation by Cathy. Rayann Bruce in [place]:  Past Surgical History:  Procedure Laterality Date   ABDOMINAL HYSTERECTOMY     ABLATION OF DYSRHYTHMIC FOCUS  2005   atrial tachycardia ablated by Cathy Bruce   ABLATION OF DYSRHYTHMIC FOCUS  01/27/[facility]   atrial tachycardia ablated by Cathy Bruce, focus located along the tricuspid valve annulus   ATRIAL FIBRILLATION ABLATION N/A 01/27/[facility]   Procedure: [place];  Surgeon: Cathy Mark, MD;  Location: [facility];  Service: Cardiovascular;  Laterality: N/A; ACTUALLY ATRIAL TACHYCARDIA   BACK SURGERY  01/2021   c-section  1997, 1988   x2   COLONOSCOPY  2017   Cathy Bruce   HEMORRHOID SURGERY  2002   PARTIAL HYSTERECTOMY  2000   TRANSTHORACIC ECHOCARDIOGRAM  01/[facility]   EF 55 to 60%.  GRII DD.  Mild AI, mild MR.   TUBAL LIGATION      Social History:  Social History   Socioeconomic History   Marital status: Married    Spouse name: Not on file   Number of children: 2   Years of education: Not on file   Highest education level: Bachelor's degree (e.g., BA, AB, BS)  Occupational History   Occupation: Government social research officer    Comment: Retired: [employer]  Tobacco Use   Smoking status: Never   Smokeless tobacco: Never  Scientific laboratory technician Use: Never used  Substance and Sexual Activity   Alcohol use: Not Currently    Comment: occasional wine   Drug use: No   Sexual activity: Not on file  Other Topics Concern   Not on file  Social History Narrative   Lives in [place] with spouse.     They have 2 children (Cathy Bruce and Cathy Bruce, & one granddaughter named Cathy Bruce born in 2018.  Another granddaughter pending in February 2020).   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 she is now currently unemployed/retired.   She exercises 2 to 3 days a week for  at least half an hour walking.  She also walks her dog just but every day several times a day.   Social Determinants of Health   Financial Resource Strain: Not on file  Food Insecurity: Not on file  Transportation Needs: Not on file  Physical Activity: Not on file  Stress: Not on file  Social Connections: Not on file  Intimate Partner Violence: Not on file    Family History:  Family History  Problem Relation Age of Onset   CAD Mother 40       CABG   Peripheral vascular disease Mother    Dementia Mother    Hypertension Mother    Heart disease Father  Lung cancer Father    Hypertension Father    Melanoma Sister    Healthy Brother    Healthy Brother    Heart disease Maternal Aunt    Heart attack Maternal Grandmother 36   CAD Maternal Grandmother 50       CABG at 22   Coronary artery disease Other    Colon cancer Cousin 52   Stomach cancer Neg Hx    Rectal cancer Neg Hx     Medications:   Current Outpatient Medications on File Prior to Visit  Medication Sig Dispense Refill   albuterol (VENTOLIN HFA) 108 (90 Base) MCG/ACT inhaler Inhale 1-2 puffs into the lungs every 6 (six) hours as needed for wheezing or shortness of breath.     Alirocumab (PRALUENT) 150 MG/ML SOAJ Take 150 mg by mouth every 14 (fourteen) days.     aspirin EC 81 MG tablet Take 81 mg by mouth every evening. Swallow whole.     Cholecalciferol (VITAMIN D3 PO) Take 1 tablet by mouth daily.     conjugated estrogens (PREMARIN) vaginal cream Place 1 Applicatorful vaginally 2 (two) times a week.     docusate sodium (COLACE) 100 MG capsule Take 1 capsule (100 mg total) by mouth 2 (two) times daily. 60 capsule 2   ezetimibe (ZETIA) 10 MG tablet Take 10 mg by mouth at bedtime.     FLUoxetine (PROZAC) 20 MG capsule Take 20 mg by mouth at bedtime.     metoprolol succinate (TOPROL-XL) 25 MG 24 hr tablet TAKE 1 TABLET BY MOUTH EVERY DAY 90 tablet 3   Multiple Vitamin (MULTIVITAMIN WITH MINERALS) TABS tablet Take 1  tablet by mouth daily. Women's One-A-Day     Na Sulfate-K Sulfate-Mg Sulf 17.5-3.13-1.6 GM/177ML SOLN Take 1 kit by mouth as directed. May use generic Suprep 354 mL 0   niacin 500 MG tablet Take 500 mg by mouth at bedtime.     nitroGLYCERIN (NITROSTAT) 0.4 MG SL tablet Place 0.4 mg under the tongue every 5 (five) minutes as needed for chest pain.     Omega-3 Fatty Acids (FISH OIL PO) Take 2 capsules by mouth at bedtime.     omeprazole (PRILOSEC) 40 MG capsule Take 40 mg by mouth daily.     ondansetron (ZOFRAN) 4 MG tablet Take 1 tablet (4 mg total) by mouth every 6 (six) hours as needed for nausea or vomiting. 20 tablet 0   simvastatin (ZOCOR) 40 MG tablet Take 40 mg by mouth at bedtime.  3   triamcinolone (KENALOG) 0.1 % paste Use as directed 1 application in the mouth or throat 2 (two) times daily as needed (sores).     No current facility-administered medications on file prior to visit.    Allergies:  No Known Allergies    OBJECTIVE:  Physical Exam  Vitals:   08/10/21 0727  BP: 128/72  Pulse: 61  Weight: 156 lb (70.8 kg)  Height: _0  (1.626 m)   Body mass index is 26.78 kg/m. No results found.  General: well developed, well nourished, very pleasant middle aged Caucasian female, seated, in no evident distress Head: head normocephalic and atraumatic.   Neck: supple with no carotid or supraclavicular bruits Cardiovascular: regular rate and rhythm, no murmurs Musculoskeletal: no deformity Skin:  no rash/petichiae Vascular:  Normal pulses all extremities   Neurologic Exam Mental Status: Awake and fully alert. Oriented to place and time. Recent and remote memory intact. Attention span, concentration and fund of knowledge appropriate. Mood and  affect appropriate.  Cranial Nerves: Pupils equal, briskly reactive to light. Extraocular movements full without nystagmus. Visual fields full to confrontation. Hearing intact. Facial sensation intact. Face, tongue, palate moves normally  and symmetrically.  Motor: Normal bulk and tone. Normal strength in all tested extremity muscles Sensory.: intact to touch , pinprick , position and vibratory sensation.  Coordination: Rapid alternating movements normal in all extremities. Finger-to-nose and heel-to-shin performed accurately bilaterally. Gait and Station: Arises from chair without difficulty. Stance is normal. Gait demonstrates normal stride length and balance without use of AD. Tandem walk and heel toe without difficulty.  Reflexes: 1+ and symmetric. Toes downgoing.        ASSESSMENT/PLAN: Cathy Bruce is a 61 y.o. year old female with severe sleep apnea dx'd 09/2020 followed by Cathy. Brett Bruce.  AutoPAP initiated 04/2021.  Patient seen today for initial CPAP compliance visit     OSA on CPAP Doing very well on CPAP - continue current settings Continue to follow with DME company for any needed supplies or CPAP related concerns    Follow up in 6 months via MyChart VV or call earlier if needed   CC:  PCP: Crist Infante, MD    I spent 24 minutes of face-to-face and non-face-to-face time with patient.  This included previsit chart review, lab review, study review, order entry, electronic health record documentation, patient education regarding sleep apnea on CPAP, review and discussion of CPAP compliance report, importance of nightly CPAP usage and answered all other questions to patient satisfaction   Frann Rider, AGNP-BC  [facility] [address] [place], [place] [address]  Phone [phone] Fax [phone] Note: This document was prepared with digital dictation and possible smart phrase technology. Any transcriptional errors that result from this process are unintentional.

## 2021-08-09 NOTE — Telephone Encounter (Signed)
Patient called and stated that she has a colonoscopy on 1/17 at [facility] and needs to reschedule. Please advise.

## 2021-08-09 NOTE — Telephone Encounter (Signed)
Pt stated that her Mother in [place] has to have emergency Surgery on 08/16/2021 and that she need to reschedule her Colonoscopy at [facility]. Pt was rescheduled to  October 17 2021 at 9:30.  A new prep letter was created and sent to pt via My Chart and Via Mail. Pt made aware: Pt verbalized understanding with all questions answered.

## 2021-08-09 NOTE — Telephone Encounter (Signed)
Pt stated that her Mother in [place] has to have emergency Surgery on 08/16/2021 and that she need to reschedule her Colonoscopy at [facility]. Pt was rescheduled to  October 17 2021 at 9:30.  A new prep letter was created and sent to pt via My Chart and Via Mail. Left message for pt to call back.

## 2021-08-10 ENCOUNTER — Other Ambulatory Visit: Payer: Self-pay

## 2021-08-10 ENCOUNTER — Ambulatory Visit (INDEPENDENT_AMBULATORY_CARE_PROVIDER_SITE_OTHER): Payer: 59 | Admitting: Adult Health

## 2021-08-10 ENCOUNTER — Encounter: Payer: Self-pay | Admitting: Adult Health

## 2021-08-10 VITALS — BP 128/72 | HR 61 | Ht 64.0 in | Wt 156.0 lb

## 2021-08-10 DIAGNOSIS — G4733 Obstructive sleep apnea (adult) (pediatric): Secondary | ICD-10-CM | POA: Diagnosis not present

## 2021-08-10 NOTE — Patient Instructions (Signed)
Continue nightly use of CPAP for optimal sleep apnea management  Continue to follow with DME company for any needed supplies or CPAP related concerns    Followup in the future with me in 6 months or call earlier if needed       Thank you for coming to see [place] at [facility]. I hope we have been able to provide you high quality care today.  You may receive a patient satisfaction survey over the next few weeks. We would appreciate your feedback and comments so that we may continue to improve ourselves and the health of our patients.

## 2021-08-11 NOTE — Telephone Encounter (Signed)
Thanks for letting me know RG 

## 2021-08-15 ENCOUNTER — Ambulatory Visit: Payer: 59 | Admitting: Neurology

## 2021-08-29 ENCOUNTER — Other Ambulatory Visit: Payer: Self-pay | Admitting: Urology

## 2021-10-05 ENCOUNTER — Encounter (HOSPITAL_BASED_OUTPATIENT_CLINIC_OR_DEPARTMENT_OTHER): Payer: Self-pay | Admitting: Urology

## 2021-10-05 ENCOUNTER — Other Ambulatory Visit: Payer: Self-pay

## 2021-10-05 NOTE — Progress Notes (Signed)
Spoke w/ via phone for pre-op interview: patient ?Lab needs dos: EKG and BMP ?Lab results: Echo 08/07/13 ?COVID test: patient states asymptomatic no test needed. ?Arrive at 9:30 AM ?NPO after MN except clear liquids.Clear liquids from MN until 8:30 AM ?Med rec completed. ?Medications to take morning of surgery: Metoprolol and Omeprazole; Albuterol PRN- bring inhaler. ?Diabetic medication: NA ?Patient instructed no nail polish to be worn day of surgery. ?Patient instructed to bring photo id and insurance card day of surgery. ?Patient aware to have Driver (ride ) / caregiver for 24 hours after surgery. (Husband to drive) ?Patient Special Instructions: Pt holding aspirin 5 days prior to sx per surgeon instructions. Pt has stopped all vitamins and supplements. ?Pre-Op special Istructions: NA ?Patient verbalized understanding of instructions that were given at this phone interview. ?Patient denies shortness of breath, chest pain, fever, cough at this phone interview.  ?

## 2021-10-07 ENCOUNTER — Encounter (HOSPITAL_COMMUNITY): Payer: Self-pay | Admitting: Gastroenterology

## 2021-10-07 NOTE — Progress Notes (Signed)
Attempted to obtain medical history via telephone, unable to reach at this time. I left a voicemail to return pre surgical testing department's phone call.  

## 2021-10-10 NOTE — H&P (Signed)
? ?CC/HPI: cc: microscopic hematuria  ? ?07/07/21: 61 year old woman referred for asymptomatic microscopic hematuria. Urinalysis done by PCP showed 5-10 RBCs per high-powered field. Patient has no family history of bladder/kidney cancer, no tobacco history and denies history of UTIs or kidney stones.  ? ?08/26/21: here for cystoscopy  ? ?  ?ALLERGIES: No Known Allergies ?  ? ?MEDICATIONS: Aspirin 81 mg tablet,chewable  ?Metoprolol Succinate 25 mg tablet, extended release 24 hr  ?Omeprazole 40 mg capsule,delayed release  ?Simvastatin 40 mg tablet  ?Simvastatin 40 mg tablet  ?Azo  ?Biotin  ?Colace  ?Ezetimibe  ?Fish Oil  ?Gas X  ?Niacin 500 mg capsule  ?Praluent Pen 150 mg/ml pen injector  ?Primary E. Ream  ?Prozac  ?Vitamin B Complex  ?Vitamin D3  ?  ? ?GU PSH: Locm 300-'399Mg'$ /Ml Iodine,1Ml - 08/19/2021 ? ?  ?   ?PSH Notes: Heart Ablation - 2004, 2008  ? ?NON-GU PSH: Back surgery - 01/28/2021 ?Cesarean Delivery - 1998, 1997 ?Shoulder Arthroscopy/surgery - 2010 ? ?  ? ?GU PMH: Microscopic hematuria - 08/19/2021, - 07/07/2021 ?  ? ?NON-GU PMH: Bacteriuria - 07/07/2021 ?Arrhythmia ?GERD ?Heart disease, unspecified ?Hypercholesterolemia ?Sleep Apnea ?  ? ?FAMILY HISTORY: 1 Daughter - Daughter ?1 son - Son ?copd - Father ?Death In The Family Father - Father ?Dementia - Mother  ? ?SOCIAL HISTORY: No Social History   ? ?REVIEW OF SYSTEMS:    ?GU Review Female:   Patient denies frequent urination, hard to postpone urination, burning /pain with urination, get up at night to urinate, leakage of urine, stream starts and stops, trouble starting your stream, have to strain to urinate, and being pregnant.  ?Gastrointestinal (Upper):   Patient denies nausea, vomiting, and indigestion/ heartburn.  ?Gastrointestinal (Lower):   Patient denies diarrhea and constipation.  ?Constitutional:   Patient denies fever, night sweats, weight loss, and fatigue.  ?Skin:   Patient denies skin rash/ lesion and itching.  ?Eyes:   Patient denies blurred  vision and double vision.  ?Ears/ Nose/ Throat:   Patient denies sore throat and sinus problems.  ?Hematologic/Lymphatic:   Patient denies swollen glands and easy bruising.  ?Cardiovascular:   Patient denies leg swelling and chest pains.  ?Respiratory:   Patient denies cough and shortness of breath.  ?Endocrine:   Patient denies excessive thirst.  ?Musculoskeletal:   Patient denies back pain and joint pain.  ?Neurological:   Patient denies headaches and dizziness.  ?Psychologic:   Patient denies depression and anxiety.  ? ?VITAL SIGNS: None  ? ?GU PHYSICAL EXAMINATION:    ?Urethral Meatus: Normal size. Normal position. No discharge.  ?Urethra: No tenderness, no mass, no scarring. No hypermobility. No leakage.  ?Bladder: Normal to palpation, no tenderness, no mass, normal size.  ?Vagina: Moderate vaginal atrophy, mild introital stenosis.   ? ?MULTI-SYSTEM PHYSICAL EXAMINATION:    ?Constitutional: Well-nourished. No physical deformities. Normally developed. Good grooming.  ?Neck: Neck symmetrical, not swollen. Normal tracheal position.  ?Respiratory: No labored breathing, no use of accessory muscles.   ?Skin: No paleness, no jaundice, no cyanosis. No lesion, no ulcer, no rash.  ?Neurologic / Psychiatric: Oriented to time, oriented to place, oriented to person. No depression, no anxiety, no agitation.  ?Eyes: Normal conjunctivae. Normal eyelids.  ?Ears, Nose, Mouth, and Throat: Left ear no scars, no lesions, no masses. Right ear no scars, no lesions, no masses. Nose no scars, no lesions, no masses. Normal hearing. Normal lips.  ?Musculoskeletal: Normal gait and station of head and neck.  ? ?  ?  Complexity of Data:  ?Urine Test Review:   Urinalysis  ?X-Ray Review: C.T. Hematuria: Reviewed Films. Reviewed Report. Discussed With Patient. IMPRESSION:  ?1. No hydronephrosis. No renal, ureteral or bladder calculi.  ?2. No solid enhancing renal mass.  ?3. Scattered sigmoid colonic diverticulosis without findings of  ?acute  diverticulitis.  ?4. Aortic Atherosclerosis (ICD10-I70.0).  ? ? ?Electronically Signed  ?By: Dahlia Bailiff M.D.  ?On: 08/19/2021 14:09 ?  ? ?PROCEDURES:    ?     Flexible Cystoscopy - 52000  ?Risks, benefits, and some of the potential complications of the procedure were discussed at length with the patient including infection, bleeding, voiding discomfort, urinary retention, fever, chills, sepsis, and others. All questions were answered. Informed consent was obtained. Antibiotic prophylaxis was given. Sterile technique and intraurethral analgesia were used.  ?Meatus:  Normal size. Normal location. Normal condition.  ?Urethra:  No hypermobility. No leakage.  ?Ureteral Orifices:  Normal location. Normal size. Normal shape. Effluxed clear urine.  ?Bladder:  Heaped up mucosa at bladder neck/tirgone seen best on retroflexion concerning for edema vs early tumor growth  ?  ?  ?The lower urinary tract was carefully examined. The procedure was well-tolerated and without complications. Antibiotic instructions were given. Instructions were given to call the office immediately for bloody urine, difficulty urinating, urinary retention, painful or frequent urination, fever, chills, nausea, vomiting or other illness. The patient stated that she understood these instructions and would comply with them.  ? ? ?     Urinalysis w/Scope ?Dipstick Dipstick Cont'd Micro  ?Color: Yellow Bilirubin: Neg mg/dL WBC/hpf: NS (Not Seen)  ?Appearance: Clear Ketones: Neg mg/dL RBC/hpf: 0 - 2/hpf  ?Specific Gravity: 1.025 Blood: Trace ery/uL Bacteria: Rare (0-9/hpf)  ?pH: <=5.0 Protein: Trace mg/dL Cystals: NS (Not Seen)  ?Glucose: Neg mg/dL Urobilinogen: 0.2 mg/dL Casts: NS (Not Seen)  ?  Nitrites: Neg Trichomonas: Not Present  ?  Leukocyte Esterase: Neg leu/uL Mucous: Not Present  ?    Epithelial Cells: 6 - 10/hpf  ?    Yeast: NS (Not Seen)  ?    Sperm: Not Present  ? ? ?ASSESSMENT:  ?    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 ?PLAN:    ? ?      Orders ?Labs Urine Cytology, Urine Cytology  ? ? ?      Document ?Letter(s):  Created for Patient: Clinical Summary  ? ? ?     Notes:   1. Microscopic hematuria: Patient was given printed copy of CT scan report which did not show any abnormalities in the GU tract. We also went over results today in person prior to cystoscopy. Cystoscopy showed heaped up bladder mucosa at bladder neck consistent with either edema or early bladder tumor growth.  ? ?2. Bladder neoplasm: Reviewed results of cystoscopy with patient. It is unclear if this is simply edema/abnormal mucosa versus early bladder cancer growth. We discussed risks and benefits of cystoscopy with bladder biopsy and fulguration in detail including but not limited to bleeding, pain, infection, damage to surrounding structures, need for additional treatment, need for Foley catheter. Patient understands these risks and wished to proceed. She will be scheduled for next fill surgery date.   ? ? ?

## 2021-10-11 ENCOUNTER — Ambulatory Visit (HOSPITAL_BASED_OUTPATIENT_CLINIC_OR_DEPARTMENT_OTHER): Payer: 59 | Admitting: Anesthesiology

## 2021-10-11 ENCOUNTER — Encounter (HOSPITAL_BASED_OUTPATIENT_CLINIC_OR_DEPARTMENT_OTHER)
Admission: RE | Disposition: A | Discharge status: Home / Self Care | Payer: Self-pay | Source: Home / Self Care | Attending: Urology

## 2021-10-11 ENCOUNTER — Other Ambulatory Visit: Payer: Self-pay

## 2021-10-11 ENCOUNTER — Ambulatory Visit (HOSPITAL_BASED_OUTPATIENT_CLINIC_OR_DEPARTMENT_OTHER)
Admission: RE | Admit: 2021-10-11 | Discharge: 2021-10-11 | Disposition: A | Discharge status: Home / Self Care | Payer: 59 | Attending: Urology | Admitting: Urology

## 2021-10-11 ENCOUNTER — Encounter (HOSPITAL_BASED_OUTPATIENT_CLINIC_OR_DEPARTMENT_OTHER): Payer: Self-pay | Admitting: Urology

## 2021-10-11 DIAGNOSIS — N3081 Other cystitis with hematuria: Secondary | ICD-10-CM | POA: Insufficient documentation

## 2021-10-11 DIAGNOSIS — M1990 Unspecified osteoarthritis, unspecified site: Secondary | ICD-10-CM | POA: Insufficient documentation

## 2021-10-11 DIAGNOSIS — I2510 Atherosclerotic heart disease of native coronary artery without angina pectoris: Secondary | ICD-10-CM | POA: Insufficient documentation

## 2021-10-11 DIAGNOSIS — D494 Neoplasm of unspecified behavior of bladder: Secondary | ICD-10-CM | POA: Diagnosis present

## 2021-10-11 DIAGNOSIS — G4730 Sleep apnea, unspecified: Secondary | ICD-10-CM

## 2021-10-11 DIAGNOSIS — F419 Anxiety disorder, unspecified: Secondary | ICD-10-CM

## 2021-10-11 DIAGNOSIS — K219 Gastro-esophageal reflux disease without esophagitis: Secondary | ICD-10-CM | POA: Diagnosis not present

## 2021-10-11 DIAGNOSIS — N329 Bladder disorder, unspecified: Secondary | ICD-10-CM

## 2021-10-11 DIAGNOSIS — I4891 Unspecified atrial fibrillation: Secondary | ICD-10-CM | POA: Insufficient documentation

## 2021-10-11 LAB — POCT I-STAT, CHEM 8
BUN: 10 mg/dL (ref 8–23)
Calcium, Ion: 1.23 mmol/L (ref 1.15–1.40)
Chloride: 106 mmol/L (ref 98–111)
Creatinine, Ser: 0.7 mg/dL (ref 0.44–1.00)
Glucose, Bld: 102 mg/dL — ABNORMAL HIGH (ref 70–99)
HCT: 36 % (ref 36.0–46.0)
Hemoglobin: 12.2 g/dL (ref 12.0–15.0)
Potassium: 3.8 mmol/L (ref 3.5–5.1)
Sodium: 143 mmol/L (ref 135–145)
TCO2: 25 mmol/L (ref 22–32)

## 2021-10-11 SURGERY — TURBT (TRANSURETHRAL RESECTION OF BLADDER TUMOR)
Anesthesia: General | Site: Bladder

## 2021-10-11 MED ORDER — EPHEDRINE 5 MG/ML INJ
INTRAVENOUS | Status: AC
  Filled 2021-10-11: qty 5

## 2021-10-11 MED ORDER — FENTANYL CITRATE (PF) 100 MCG/2ML IJ SOLN
INTRAMUSCULAR | Status: AC
  Filled 2021-10-11: qty 2

## 2021-10-11 MED ORDER — ACETAMINOPHEN 10 MG/ML IV SOLN: 1000.0000 mg | Freq: Once | INTRAVENOUS | Status: DC | PRN

## 2021-10-11 MED ORDER — ACETAMINOPHEN 160 MG/5ML PO SOLN: 325.0000 mg | ORAL | Status: DC | PRN

## 2021-10-11 MED ORDER — PHENYLEPHRINE 40 MCG/ML (10ML) SYRINGE FOR IV PUSH (FOR BLOOD PRESSURE SUPPORT)
PREFILLED_SYRINGE | INTRAVENOUS | Status: AC
  Filled 2021-10-11: qty 10

## 2021-10-11 MED ORDER — SODIUM CHLORIDE 0.9 % IR SOLN
Status: DC | PRN
  Administered 2021-10-11: 3000 mL via INTRAVESICAL

## 2021-10-11 MED ORDER — GLYCOPYRROLATE 0.2 MG/ML IJ SOLN
INTRAMUSCULAR | Status: DC | PRN
  Administered 2021-10-11: .1 mg via INTRAVENOUS

## 2021-10-11 MED ORDER — MEPERIDINE HCL 25 MG/ML IJ SOLN: 6.2500 mg | INTRAMUSCULAR | Status: DC | PRN

## 2021-10-11 MED ORDER — DEXAMETHASONE SODIUM PHOSPHATE 4 MG/ML IJ SOLN
INTRAMUSCULAR | Status: DC | PRN
Start: 2021-10-11 — End: 2021-10-11
  Administered 2021-10-11: 8 mg via INTRAVENOUS

## 2021-10-11 MED ORDER — LIDOCAINE HCL (CARDIAC) PF 100 MG/5ML IV SOSY
PREFILLED_SYRINGE | INTRAVENOUS | Status: DC | PRN
  Administered 2021-10-11: 100 mg via INTRAVENOUS

## 2021-10-11 MED ORDER — MIDAZOLAM HCL 2 MG/2ML IJ SOLN
INTRAMUSCULAR | Status: AC
  Filled 2021-10-11: qty 2

## 2021-10-11 MED ORDER — LACTATED RINGERS IV SOLN: INTRAVENOUS | Status: DC

## 2021-10-11 MED ORDER — FENTANYL CITRATE (PF) 100 MCG/2ML IJ SOLN
25.0000 ug | INTRAMUSCULAR | Status: DC | PRN
  Administered 2021-10-11: 50 ug via INTRAVENOUS

## 2021-10-11 MED ORDER — EPHEDRINE SULFATE (PRESSORS) 50 MG/ML IJ SOLN
INTRAMUSCULAR | Status: DC | PRN
  Administered 2021-10-11: 5 mg via INTRAVENOUS

## 2021-10-11 MED ORDER — ONDANSETRON HCL 4 MG/2ML IJ SOLN: 4.0000 mg | Freq: Once | INTRAMUSCULAR | Status: DC | PRN

## 2021-10-11 MED ORDER — OXYCODONE HCL 5 MG PO TABS: 5.0000 mg | ORAL_TABLET | Freq: Once | ORAL | Status: DC | PRN

## 2021-10-11 MED ORDER — TRAMADOL HCL 50 MG PO TABS: 50.0000 mg | ORAL_TABLET | Freq: Four times a day (QID) | ORAL | 0 refills | Status: AC | PRN

## 2021-10-11 MED ORDER — CEFAZOLIN SODIUM-DEXTROSE 2-4 GM/100ML-% IV SOLN
2.0000 g | INTRAVENOUS | Status: AC
  Administered 2021-10-11: 2 g via INTRAVENOUS

## 2021-10-11 MED ORDER — PROPOFOL 10 MG/ML IV BOLUS
INTRAVENOUS | Status: AC
  Filled 2021-10-11: qty 20

## 2021-10-11 MED ORDER — ACETAMINOPHEN 325 MG PO TABS: 325.0000 mg | ORAL_TABLET | ORAL | Status: DC | PRN

## 2021-10-11 MED ORDER — ONDANSETRON HCL 4 MG/2ML IJ SOLN
INTRAMUSCULAR | Status: DC | PRN
  Administered 2021-10-11: 4 mg via INTRAVENOUS

## 2021-10-11 MED ORDER — CEFAZOLIN SODIUM-DEXTROSE 2-4 GM/100ML-% IV SOLN
INTRAVENOUS | Status: AC
  Filled 2021-10-11: qty 100

## 2021-10-11 MED ORDER — PROPOFOL 10 MG/ML IV BOLUS
INTRAVENOUS | Status: DC | PRN
Start: 2021-10-11 — End: 2021-10-11
  Administered 2021-10-11: 150 mg via INTRAVENOUS

## 2021-10-11 MED ORDER — FENTANYL CITRATE (PF) 100 MCG/2ML IJ SOLN
INTRAMUSCULAR | Status: DC | PRN
  Administered 2021-10-11: 50 ug via INTRAVENOUS

## 2021-10-11 MED ORDER — LIDOCAINE HCL (PF) 2 % IJ SOLN
INTRAMUSCULAR | Status: AC
  Filled 2021-10-11: qty 10

## 2021-10-11 MED ORDER — MIDAZOLAM HCL 2 MG/2ML IJ SOLN
INTRAMUSCULAR | Status: DC | PRN
  Administered 2021-10-11: 2 mg via INTRAVENOUS

## 2021-10-11 MED ORDER — OXYCODONE HCL 5 MG/5ML PO SOLN: 5.0000 mg | Freq: Once | ORAL | Status: DC | PRN

## 2021-10-11 MED ORDER — GLYCOPYRROLATE PF 0.2 MG/ML IJ SOSY
PREFILLED_SYRINGE | INTRAMUSCULAR | Status: AC
  Filled 2021-10-11: qty 1

## 2021-10-11 SURGICAL SUPPLY — 21 items
BAG DRAIN URO-CYSTO SKYTR STRL (DRAIN) ×4 IMPLANT
BAG DRN RND TRDRP ANRFLXCHMBR (UROLOGICAL SUPPLIES)
BAG DRN UROCATH (DRAIN) ×2
BAG URINE DRAIN 2000ML AR STRL (UROLOGICAL SUPPLIES) IMPLANT
CATH FOLEY 2WAY SLVR  5CC 18FR (CATHETERS)
CATH FOLEY 2WAY SLVR 5CC 18FR (CATHETERS) IMPLANT
CLOTH BEACON ORANGE TIMEOUT ST (SAFETY) ×4 IMPLANT
ELECT REM PT RETURN 9FT ADLT (ELECTROSURGICAL) ×3
ELECTRODE REM PT RTRN 9FT ADLT (ELECTROSURGICAL) ×3 IMPLANT
GLOVE SURG ENC MOIS LTX SZ6.5 (GLOVE) ×4 IMPLANT
GOWN STRL REUS W/TWL LRG LVL3 (GOWN DISPOSABLE) ×4 IMPLANT
HOLDER FOLEY CATH W/STRAP (MISCELLANEOUS) IMPLANT
IV NS IRRIG 3000ML ARTHROMATIC (IV SOLUTION) ×4 IMPLANT
KIT TURNOVER CYSTO (KITS) ×4 IMPLANT
LOOP CUT BIPOLAR 24F LRG (ELECTROSURGICAL) ×1 IMPLANT
MANIFOLD NEPTUNE II (INSTRUMENTS) ×4 IMPLANT
PACK CYSTO (CUSTOM PROCEDURE TRAY) ×4 IMPLANT
SYR TOOMEY IRRIG 70ML (MISCELLANEOUS) ×3
SYRINGE TOOMEY IRRIG 70ML (MISCELLANEOUS) ×3 IMPLANT
TUBE CONNECTING 12X1/4 (SUCTIONS) ×4 IMPLANT
TUBING UROLOGY SET (TUBING) ×4 IMPLANT

## 2021-10-11 NOTE — Transfer of Care (Signed)
Immediate Anesthesia Transfer of Care Note ? ?Patient: Cathy Bruce ? ?Procedure(s) Performed: TRANSURETHRAL RESECTION OF BLADDER TUMOR (TURBT) WITH URETHRAL DILATION (Bladder) ?CYSTOSCOPY ? ?Patient Location: PACU ? ?Anesthesia Type:General ? ?Level of Consciousness: awake, alert , oriented and patient cooperative ? ?Airway & Oxygen Therapy: Patient Spontanous Breathing and Patient connected to nasal cannula oxygen ? ?Post-op Assessment: Report given to RN and Post -op Vital signs reviewed and stable ? ?Post vital signs: Reviewed and stable ? ?Last Vitals:  ?Vitals Value Taken Time  ?BP 130/67 10/11/21 1200  ?Temp    ?Pulse 86 10/11/21 1202  ?Resp 12 10/11/21 1202  ?SpO2 98 % 10/11/21 1202  ?Vitals shown include unvalidated device data. ? ?Last Pain:  ?Vitals:  ? 10/11/21 0952  ?TempSrc: Oral  ?PainSc: 0-No pain  ?   ? ?Patients Stated Pain Goal: 4 (10/11/21 5217) ? ?Complications: No notable events documented. ?

## 2021-10-11 NOTE — Anesthesia Procedure Notes (Signed)
Procedure Name: LMA Insertion ?Date/Time: 10/11/2021 11:28 AM ?Performed by: Georgeanne Nim, CRNA ?Pre-anesthesia Checklist: Patient identified, Emergency Drugs available, Suction available, Patient being monitored and Timeout performed ?Patient Re-evaluated:Patient Re-evaluated prior to induction ?Oxygen Delivery Method: Circle system utilized ?Preoxygenation: Pre-oxygenation with 100% oxygen ?Induction Type: IV induction ?Ventilation: Mask ventilation without difficulty ?LMA: LMA inserted ?LMA Size: 4.0 ?Number of attempts: 1 ?Placement Confirmation: positive ETCO2, CO2 detector and breath sounds checked- equal and bilateral ?Tube secured with: Tape ?Dental Injury: Teeth and Oropharynx as per pre-operative assessment  ? ? ? ? ?

## 2021-10-11 NOTE — Discharge Instructions (Addendum)
Post Bladder Surgery Instructions ? ? ?General instructions: ?   ? Your recent bladder surgery requires very little post hospital care but some definite precautions. ? ?Despite the fact that no skin incisions were used, the area around the bladder incisions are raw and covered with scabs to promote healing and prevent bleeding. Certain precautions are needed to insure that the scabs are not disturbed over the next 2-4 weeks while the healing proceeds. ? ?Because the raw surface inside your bladder and the irritating effects of urine you may expect frequency of urination and/or urgency (a stronger desire to urinate) and perhaps even getting up at night more often. This will usually resolve or improve slowly over the healing period. You may see some blood in your urine over the first 6 weeks. Do not be alarmed, even if the urine was clear for a while. Get off your feet and drink lots of fluids until clearing occurs. If you start to pass clots or don't improve call us. ? ?Catheter: (If you are discharged with a catheter.) ? ?1. Keep your catheter secured to your leg at all times with tape or the supplied strap. ?2. You may experience leakage of urine around your catheter- as long as the  ?catheter continues to drain, this is normal.  If your catheter stops draining  ?go to the ER. ?3. You may also have blood in your urine, even after it has been clear for  ?several days; you may even pass some small blood clots or other material.  This  ?is normal as well.  If this happens, sit down and drink plenty of water to help  ?make urine to flush out your bladder.  If the blood in your urine becomes worse  ?after doing this, contact our office or return to the ER. ?4. You may use the leg bag (small bag) during the day, but use the large bag at  ?night. ? ?Diet: ? ?You may return to your normal diet immediately. Because of the raw surface of your bladder, alcohol, spicy foods, foods high in acid and drinks with caffeine may  cause irritation or frequency and should be used in moderation. To keep your urine flowing freely and avoid constipation, drink plenty of fluids during the day (8-10 glasses). Tip: Avoid cranberry juice because it is very acidic. ? ?Activity: ? ?Your physical activity doesn't need to be restricted. However, if you are very active, you may see some blood in the urine. We suggest that you reduce your activity under the circumstances until the bleeding has stopped. ? ?Bowels: ? ?It is important to keep your bowels regular during the postoperative period. Straining with bowel movements can cause bleeding. A bowel movement every other day is reasonable. Use a mild laxative if needed, such as milk of magnesia 2-3 tablespoons, or 2 Dulcolax tablets. Call if you continue to have problems. If you had been taking narcotics for pain, before, during or after your surgery, you may be constipated. Take a laxative if necessary. ? ? ? ?Medication: ? ?You should resume your pre-surgery medications unless told not to. In addition you may be given an antibiotic to prevent or treat infection. Antibiotics are not always necessary. All medication should be taken as prescribed until the bottles are finished unless you are having an unusual reaction to one of the drugs. ? ?Tramadol - this may help with severe pain, do not drive while taking ?AZO - this will help with burning with urination (it is over the  counter at the pharmacy) ?You can also take tylenol for mild to moderate discomfort ? ? ?Post Anesthesia Home Care Instructions ? ?Activity: ?Get plenty of rest for the remainder of the day. A responsible individual must stay with you for 24 hours following the procedure.  ?For the next 24 hours, DO NOT: ?-Drive a car ?-Paediatric nurse ?-Drink alcoholic beverages ?-Take any medication unless instructed by your physician ?-Make any legal decisions or sign important papers. ? ?Meals: ?Start with liquid foods such as gelatin or soup.  Progress to regular foods as tolerated. Avoid greasy, spicy, heavy foods. If nausea and/or vomiting occur, drink only clear liquids until the nausea and/or vomiting subsides. Call your physician if vomiting continues. ? ?Special Instructions/Symptoms: ?Your throat may feel dry or sore from the anesthesia or the breathing tube placed in your throat during surgery. If this causes discomfort, gargle with warm salt water. The discomfort should disappear within 24 hours. ? ? ?    ?

## 2021-10-11 NOTE — Op Note (Addendum)
PATIENT:  Cathy Bruce ? ?PRE-OPERATIVE DIAGNOSIS: Bladder tumor ? ?POST-OPERATIVE DIAGNOSIS: Same ? ?PROCEDURE:   ?Urethral dilation from 20 [place] to 26 [place] ?Diagnostic cystoscopy ?Transurethral resection of bladder lesion less than 0.5 cm ? ?SURGEON:   ?Brielyn Bosak, MD ? ?ANESTHESIA:   General ? ?EBL:  Minimal ? ?DRAINS:  None ? ?SPECIMEN:  Bladder lesion ? ?DISPOSITION OF SPECIMEN:  PATHOLOGY ? ?Findings: ?1.  Slightly erythematous lesion at right bladder neck around 7:00 ?2.  Normal urethra ?3.  Bilateral orthotopic ureteral orifices ?4.  No distinct bladder masses or abnormal bladder mucosa with the exception of the bladder neck previously noted, no trabeculation ? ?Indication: 61 year old woman initially seen for microscopic hematuria found to have bladder lesion on diagnostic cystoscopy in the office. ? ?Description of operation: The patient was taken to the operating room and administered general anesthesia. They were then placed on the table and moved to the dorsal lithotomy position after which the genitalia was sterilely prepped and draped. An official timeout was then performed. ? ?Patient's urethra was then dilated with female sounds from 68 [place] to 23 [place].  The 60 French resectoscope with the 30? lens and nonvisual obturator were then passed into the bladder under direct visualization. The nonvisual obturator was then removed and the Gyrus resectoscope element with 30 ? lens was then inserted and the bladder was fully and systematically inspected. Ureteral orifices were noted to be in the normal anatomic positions.  ? ?The previous area of concern at the bladder neck and trigone was reexamined.  No distinct mass was noted and patient's bladder was distended.  There was a slight area of erythema at the right bladder neck.  The resectoscope was then used to resect this area with bipolar loop.  Hemostasis was then achieved with electrocautery using the loop. ? ?The specimen was removed  from the bladder and sent to pathology.  Hemostasis was deemed adequate with irrigant turned off.  The patient's bladder was decompressed.  The resectoscope was removed and she was repositioned in the supine position. ? ?The patient was awakened and taken to the recovery room. ? ? ?PLAN OF CARE: Discharge to home after PACU ? ?PATIENT DISPOSITION:  PACU - hemodynamically stable.  ?

## 2021-10-11 NOTE — Anesthesia Preprocedure Evaluation (Signed)
Anesthesia Evaluation  ?Patient identified by MRN, date of birth, ID band ?Patient awake ? ? ? ?Reviewed: ?Allergy & Precautions, NPO status , Patient's Chart, lab work & pertinent test results ? ?Airway ?Mallampati: III ? ?TM Distance: >3 FB ?Neck ROM: Full ? ? ? Dental ? ?(+) Teeth Intact, Dental Advisory Given ?  ?Pulmonary ?sleep apnea ,  ?  ?breath sounds clear to auscultation ? ? ? ? ? ? Cardiovascular ?+ CAD  ?Normal cardiovascular exam+ dysrhythmias Atrial Fibrillation  ?Rhythm:Regular Rate:Normal ? ? ?  ?Neuro/Psych ?PSYCHIATRIC DISORDERS Anxiety negative neurological ROS ?   ? GI/Hepatic ?Neg liver ROS, GERD  Medicated,  ?Endo/Other  ?negative endocrine ROS ? Renal/GU ?negative Renal ROS Bladder dysfunction ? ? ? ?  ?Musculoskeletal ? ?(+) Arthritis ,  ? Abdominal ?Normal abdominal exam  (+)   ?Peds ? Hematology ?negative hematology ROS ?(+)   ?Anesthesia Other Findings ? ? Reproductive/Obstetrics ? ?  ? ? ? ? ? ? ? ? ? ? ? ? ? ?  ?  ? ? ? ? ? ? ? ? ?Anesthesia Physical ? ?Anesthesia Plan ? ?ASA: 3 ? ?Anesthesia Plan: General  ? ?Post-op Pain Management: Dilaudid IV  ? ?Induction: Intravenous ? ?PONV Risk Score and Plan: 4 or greater and Ondansetron, Dexamethasone, Midazolam and Scopolamine patch - Pre-op ? ?Airway Management Planned: LMA ? ?Additional Equipment: None ? ?Intra-op Plan:  ? ?Post-operative Plan: Extubation in OR ? ?Informed Consent: I have reviewed the patients History and Physical, chart, labs and discussed the procedure including the risks, benefits and alternatives for the proposed anesthesia with the patient or authorized representative who has indicated his/her understanding and acceptance.  ? ? ? ?Dental advisory given ? ?Plan Discussed with: CRNA ? ?Anesthesia Plan Comments: ( ???interval change. ? ?)  ? ? ? ? ? ? ?Anesthesia Quick Evaluation ? ?

## 2021-10-11 NOTE — Interval H&P Note (Signed)
History and Physical Interval Note: ? ?10/11/2021 ?10:55 AM ? ?Cathy Bruce  has presented today for surgery, with the diagnosis of BLADDER NEOPLASM.  The various methods of treatment have been discussed with the patient and family. After consideration of risks, benefits and other options for treatment, the patient has consented to  Procedure(s): ?TRANSURETHRAL RESECTION OF BLADDER TUMOR (TURBT) (N/A) ?CYSTOSCOPY (N/A) as a surgical intervention.  The patient's history has been reviewed, patient examined, no change in status, stable for surgery.  I have reviewed the patient's chart and labs.  Questions were answered to the patient's satisfaction.   ? ? ?Hindy Perrault D Cheyene Hamric ? ? ?

## 2021-10-12 ENCOUNTER — Encounter (HOSPITAL_BASED_OUTPATIENT_CLINIC_OR_DEPARTMENT_OTHER): Payer: Self-pay | Admitting: Urology

## 2021-10-12 LAB — SURGICAL PATHOLOGY

## 2021-10-13 NOTE — Anesthesia Postprocedure Evaluation (Signed)
Anesthesia Post Note ? ?Patient: Cathy Bruce ? ?Procedure(s) Performed: TRANSURETHRAL RESECTION OF BLADDER TUMOR (TURBT) WITH URETHRAL DILATION (Bladder) ?CYSTOSCOPY ? ?  ? ?Patient location during evaluation: PACU ?Anesthesia Type: General ?Level of consciousness: awake ?Pain management: pain level controlled ?Vital Signs Assessment: post-procedure vital signs reviewed and stable ?Respiratory status: spontaneous breathing ?Cardiovascular status: stable ?Postop Assessment: no apparent nausea or vomiting ?Anesthetic complications: no ? ? ?No notable events documented. ? ?Last Vitals:  ?Vitals:  ? 10/11/21 1230 10/11/21 1252  ?BP: 125/60 132/78  ?Pulse: 83 71  ?Resp: 16 17  ?Temp:    ?SpO2: 94% 97%  ?  ?Last Pain:  ?Vitals:  ? 10/11/21 1252  ?TempSrc:   ?PainSc: 0-No pain  ? ? ?  ?  ?  ?  ?  ?  ? ?Huston Foley ? ? ? ? ?

## 2021-10-17 ENCOUNTER — Ambulatory Visit (HOSPITAL_COMMUNITY)
Admission: RE | Admit: 2021-10-17 | Discharge: 2021-10-17 | Disposition: A | Discharge status: Home / Self Care | Payer: 59 | Source: Ambulatory Visit | Attending: Gastroenterology | Admitting: Gastroenterology

## 2021-10-17 ENCOUNTER — Ambulatory Visit (HOSPITAL_COMMUNITY): Payer: 59 | Admitting: Certified Registered Nurse Anesthetist

## 2021-10-17 ENCOUNTER — Ambulatory Visit (HOSPITAL_BASED_OUTPATIENT_CLINIC_OR_DEPARTMENT_OTHER): Payer: 59 | Admitting: Certified Registered Nurse Anesthetist

## 2021-10-17 ENCOUNTER — Other Ambulatory Visit: Payer: Self-pay

## 2021-10-17 ENCOUNTER — Encounter (HOSPITAL_COMMUNITY): Payer: Self-pay | Admitting: Gastroenterology

## 2021-10-17 ENCOUNTER — Encounter (HOSPITAL_COMMUNITY)
Admission: RE | Disposition: A | Discharge status: Home / Self Care | Payer: Self-pay | Source: Ambulatory Visit | Attending: Gastroenterology

## 2021-10-17 DIAGNOSIS — Z860100 Personal history of colon polyps, unspecified: Secondary | ICD-10-CM

## 2021-10-17 DIAGNOSIS — I2510 Atherosclerotic heart disease of native coronary artery without angina pectoris: Secondary | ICD-10-CM | POA: Diagnosis not present

## 2021-10-17 DIAGNOSIS — K219 Gastro-esophageal reflux disease without esophagitis: Secondary | ICD-10-CM | POA: Insufficient documentation

## 2021-10-17 DIAGNOSIS — K6389 Other specified diseases of intestine: Secondary | ICD-10-CM | POA: Insufficient documentation

## 2021-10-17 DIAGNOSIS — K5730 Diverticulosis of large intestine without perforation or abscess without bleeding: Secondary | ICD-10-CM

## 2021-10-17 DIAGNOSIS — Z1211 Encounter for screening for malignant neoplasm of colon: Secondary | ICD-10-CM | POA: Diagnosis present

## 2021-10-17 DIAGNOSIS — K648 Other hemorrhoids: Secondary | ICD-10-CM | POA: Diagnosis not present

## 2021-10-17 HISTORY — PX: COLONOSCOPY WITH PROPOFOL: SHX5780

## 2021-10-17 SURGERY — COLONOSCOPY WITH PROPOFOL
Anesthesia: Monitor Anesthesia Care

## 2021-10-17 MED ORDER — LIDOCAINE 2% (20 MG/ML) 5 ML SYRINGE
INTRAMUSCULAR | Status: DC | PRN
  Administered 2021-10-17: 50 mg via INTRAVENOUS

## 2021-10-17 MED ORDER — PROPOFOL 10 MG/ML IV BOLUS
INTRAVENOUS | Status: DC | PRN
  Administered 2021-10-17 (×2): 30 mg via INTRAVENOUS

## 2021-10-17 MED ORDER — LACTATED RINGERS IV SOLN
INTRAVENOUS | Status: AC | PRN
  Administered 2021-10-17: 10 mL/h via INTRAVENOUS

## 2021-10-17 MED ORDER — PROPOFOL 500 MG/50ML IV EMUL
INTRAVENOUS | Status: DC | PRN
  Administered 2021-10-17: 175 ug/kg/min via INTRAVENOUS

## 2021-10-17 MED ORDER — PROPOFOL 1000 MG/100ML IV EMUL
INTRAVENOUS | Status: AC
  Filled 2021-10-17: qty 100

## 2021-10-17 SURGICAL SUPPLY — 22 items
ELECT REM PT RETURN 9FT ADLT (ELECTROSURGICAL)
ELECTRODE REM PT RTRN 9FT ADLT (ELECTROSURGICAL) IMPLANT
FCP BXJMBJMB 240X2.8X (CUTTING FORCEPS)
FLOOR PAD 36X40 (MISCELLANEOUS) ×2
FORCEPS BIOP RAD 4 LRG CAP 4 (CUTTING FORCEPS) IMPLANT
FORCEPS BIOP RJ4 240 W/NDL (CUTTING FORCEPS)
FORCEPS BXJMBJMB 240X2.8X (CUTTING FORCEPS) IMPLANT
INJECTOR/SNARE I SNARE (MISCELLANEOUS) IMPLANT
LUBRICANT JELLY 4.5OZ STERILE (MISCELLANEOUS) IMPLANT
MANIFOLD NEPTUNE II (INSTRUMENTS) IMPLANT
NDL SCLEROTHERAPY 25GX240 (NEEDLE) IMPLANT
NEEDLE SCLEROTHERAPY 25GX240 (NEEDLE) IMPLANT
PAD FLOOR 36X40 (MISCELLANEOUS) ×2 IMPLANT
PROBE APC STR FIRE (PROBE) IMPLANT
PROBE INJECTION GOLD (MISCELLANEOUS)
PROBE INJECTION GOLD 7FR (MISCELLANEOUS) IMPLANT
SNARE ROTATE MED OVAL 20MM (MISCELLANEOUS) IMPLANT
SYR 50ML LL SCALE MARK (SYRINGE) IMPLANT
TRAP SPECIMEN MUCOUS 40CC (MISCELLANEOUS) IMPLANT
TUBING ENDO SMARTCAP PENTAX (MISCELLANEOUS) IMPLANT
TUBING IRRIGATION ENDOGATOR (MISCELLANEOUS) ×3 IMPLANT
WATER STERILE IRR 1000ML POUR (IV SOLUTION) IMPLANT

## 2021-10-17 NOTE — Anesthesia Postprocedure Evaluation (Signed)
Anesthesia Post Note ? ?Patient: Cathy Bruce ? ?Procedure(s) Performed: COLONOSCOPY WITH PROPOFOL ? ?  ? ?Patient location during evaluation: PACU ?Anesthesia Type: MAC ?Level of consciousness: awake and alert ?Pain management: pain level controlled ?Vital Signs Assessment: post-procedure vital signs reviewed and stable ?Respiratory status: spontaneous breathing, nonlabored ventilation, respiratory function stable and patient connected to nasal cannula oxygen ?Cardiovascular status: stable and blood pressure returned to baseline ?Postop Assessment: no apparent nausea or vomiting ?Anesthetic complications: no ? ? ?No notable events documented. ? ?Last Vitals:  ?Vitals:  ? 10/17/21 1022 10/17/21 1032  ?BP:  110/64  ?Pulse: 77 75  ?Resp: 15 12  ?Temp:    ?SpO2: 94% 93%  ?  ?Last Pain:  ?Vitals:  ? 10/17/21 1020  ?TempSrc:   ?PainSc: 0-No pain  ? ? ?  ?  ?  ?  ?  ?  ? ?Effie Berkshire ? ? ? ? ?

## 2021-10-17 NOTE — H&P (Signed)
[facility] History and Physical ? ? ?Primary Care Physician:  Crist Infante, MD ? ? ?Reason for Procedure:  History of polyps. ? ?Plan:    Colonoscopy ? ? ? ? ?HPI: Cathy Bruce is a 61 y.o. female  ? ? ?Past Medical History:  ?Diagnosis Date  ? ADD (attention deficit disorder)   ? Atrial tachycardia [facility])   ? s/p ablation by Dr Caryl Comes in 2005  ? Coronary artery disease, non-occlusive 06/2018  ? High coronary calcium score: Coronary CTA shows heavily calcified proximal LAD lesion with moderate stenosis (FFR 0.95 across lesion, distal LAD FFR 0.82 -- not likely physiologic significant).  Mild disease elsewhere.  ? DDD (degenerative disc disease), lumbar   ? Difficult intubation   ? Diverticulosis   ? Dyslipidemia   ? As of October 2019: LDL P 1564.  Goal less than 100.  ? Dysrhythmia   ? GERD (gastroesophageal reflux disease)   ? Internal hemorrhoids   ? Pneumonia   ? Sleep apnea   ? uses CPAP  ? Social anxiety disorder   ? SVT (supraventricular tachycardia) ([place])   ? Status post ablation by Dr. Rayann Heman in 2015  ? ? ?Past Surgical History:  ?Procedure Laterality Date  ? ABDOMINAL HYSTERECTOMY    ? ABLATION OF DYSRHYTHMIC FOCUS  2005  ? atrial tachycardia ablated by Dr Caryl Comes  ? ABLATION OF DYSRHYTHMIC FOCUS  08/26/2013  ? atrial tachycardia ablated by Dr Rayann Heman, focus located along the tricuspid valve annulus  ? ATRIAL FIBRILLATION ABLATION N/A 08/26/2013  ? Procedure: ATRIAL FIBRILLATION ABLATION;  Surgeon: Coralyn Mark, MD;  Location: [facility];  Service: Cardiovascular;  Laterality: N/A; ACTUALLY ATRIAL TACHYCARDIA  ? BACK SURGERY  01/2021  ? Lower back vertebral pad replacement, 2 screws in place  ? c-section  1997, 1988  ? x2  ? COLONOSCOPY  2017  ? Dr.Pyrtle  ? CYSTOSCOPY N/A 10/11/2021  ? Procedure: CYSTOSCOPY;  Surgeon: Robley Fries, MD;  Location: [facility];  Service: Urology;  Laterality: N/A;  ? [place] SURGERY  2002  ? PARTIAL HYSTERECTOMY  2000  ? SHOULDER  SURGERY Right   ? TRANSTHORACIC ECHOCARDIOGRAM  07/2013  ? EF 55 to 60%.  GRII DD.  Mild AI, mild MR.  ? TRANSURETHRAL RESECTION OF BLADDER TUMOR N/A 10/11/2021  ? Procedure: TRANSURETHRAL RESECTION OF BLADDER TUMOR (TURBT) WITH URETHRAL DILATION;  Surgeon: Robley Fries, MD;  Location: [facility];  Service: Urology;  Laterality: N/A;  ? TUBAL LIGATION    ? ? ?Prior to Admission medications   ?Medication Sig Start Date End Date Taking? Authorizing Provider  ?Alirocumab (PRALUENT) 150 MG/ML SOAJ Take 150 mg by mouth every 14 (fourteen) days. 06/10/19  Yes [provider]  ?aspirin EC 81 MG tablet Take 81 mg by mouth every evening. Swallow whole.   Yes [provider]  ?Cholecalciferol (VITAMIN D3 PO) Take 1 tablet by mouth daily.   Yes [provider]  ?conjugated estrogens (PREMARIN) vaginal cream Place 1 Applicatorful vaginally 2 (two) times a week.   Yes [provider]  ?docusate sodium (COLACE) 100 MG capsule Take 1 capsule (100 mg total) by mouth 2 (two) times daily. 02/26/21  Yes Vallarie Mare, MD  ?ezetimibe (ZETIA) 10 MG tablet Take 10 mg by mouth at bedtime.   Yes [provider]  ?FLUoxetine (PROZAC) 20 MG capsule Take 20 mg by mouth at bedtime. 02/03/20  Yes [provider]  ?ibuprofen (ADVIL) 200  MG tablet Take 400 mg by mouth every 6 (six) hours as needed.   Yes [provider]  ?metoprolol succinate (TOPROL-XL) 25 MG 24 hr tablet TAKE 1 TABLET BY MOUTH EVERY DAY 06/27/21  Yes Leonie Man, MD  ?Multiple Vitamin (MULTIVITAMIN WITH MINERALS) TABS tablet Take 1 tablet by mouth daily. Women's One-A-Day   Yes [provider]  ?niacin 500 MG tablet Take 500 mg by mouth at bedtime.   Yes [provider]  ?Omega-3 Fatty Acids (FISH OIL PO) Take 2 capsules by mouth at bedtime.   Yes [provider]  ?omeprazole (PRILOSEC) 40 MG capsule Take 40 mg by mouth daily. 03/30/14  Yes [provider]  ?simvastatin (ZOCOR) 40 MG tablet Take 40 mg by mouth at bedtime. 01/05/16  Yes [provider]  ?traMADol (ULTRAM) 50 MG tablet Take 1 tablet (50 mg total) by mouth every 6 (six) hours as needed. 10/11/21 10/11/22 Yes Pace, Johny Chess, MD  ?albuterol (VENTOLIN HFA) 108 (90 Base) MCG/ACT inhaler Inhale 1-2 puffs into the lungs every 6 (six) hours as needed for wheezing or shortness of breath. 10/10/19   [provider]  ?nitroGLYCERIN (NITROSTAT) 0.4 MG SL tablet Place 0.4 mg under the tongue every 5 (five) minutes as needed for chest pain.    [provider]  ?ondansetron (ZOFRAN) 4 MG tablet Take 1 tablet (4 mg total) by mouth every 6 (six) hours as needed for nausea or vomiting. 02/26/21   Vallarie Mare, MD  ?triamcinolone (KENALOG) 0.1 % paste Use as directed 1 application in the mouth or throat 2 (two) times daily as needed (sores).    [provider]  ? ? ?Current Facility-Administered Medications  ?Medication Dose Route Frequency Provider Last Rate Last Admin  ? lactated ringers infusion    Continuous PRN Jackquline Denmark, MD 10 mL/hr at 10/17/21 0836 10 mL/hr at 10/17/21 0836  ? ? ?Allergies as of 06/21/2021  ? (No Known Allergies)  ? ? ?Family History  ?Problem Relation Age of Onset  ? CAD Mother 8  ?     CABG  ? Peripheral vascular disease Mother   ? Dementia Mother   ? Hypertension Mother   ? Heart disease Father   ? Lung cancer Father   ? Hypertension Father   ? Melanoma Sister   ? Healthy Brother   ? Healthy Brother   ? Heart disease Maternal Aunt   ? Heart attack Maternal Grandmother 50  ? CAD Maternal Grandmother 18  ?     CABG at 84  ? Coronary artery disease Other   ? Colon cancer Cousin 8  ? Stomach cancer Neg Hx   ? Rectal cancer Neg Hx   ? ? ?Social History  ? ?Socioeconomic History  ? Marital status: Married  ?  Spouse name: Not on file  ? Number of children: 2  ? Years of education: Not on file  ? Highest education level: Bachelor's degree (e.g., BA, AB,  BS)  ?Occupational History  ? Occupation: Government social research officer  ?  Comment: Retired: [employer]  ?Tobacco Use  ? Smoking status: Never  ? Smokeless tobacco: Never  ?Vaping Use  ? Vaping Use: Never used  ?Substance and Sexual Activity  ? Alcohol use: Not Currently  ?  Comment: occasional wine  ? Drug use: No  ? Sexual activity: Not on file  ?Other Topics Concern  ? Not on file  ?Social History Narrative  ? Lives in  [place] with spouse.    ? They have 2 children (Abby and Saralyn Pilar, & one granddaughter named Festus Holts born in 2018.  Another granddaughter pending in February 2020).  ? 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 she is now currently unemployed/retired.  ? She exercises 2 to 3 days a week for at least half an hour walking.  She also walks her dog just but every day several times a day.  ? ?Social Determinants of Health  ? ?Financial Resource Strain: Not on file  ?Food Insecurity: Not on file  ?Transportation Needs: Not on file  ?Physical Activity: Not on file  ?Stress: Not on file  ?Social Connections: Not on file  ?Intimate Partner Violence: Not on file  ? ? ?Review of Systems: ?Positive for none ?All other review of systems negative except as mentioned in the HPI. ? ?Physical Exam: ?Vital signs in last 24 hours: ?Temp:  [98.4 ?F (36.9 ?C)] 98.4 ?F (36.9 ?C) (03/20 0827) ?Pulse Rate:  [66] 66 (03/20 0827) ?Resp:  [15] 15 (03/20 0827) ?BP: (133)/(63) 133/63 (03/20 0827) ?SpO2:  [98 %] 98 % (03/20 0827) ?Weight:  [69.9 kg] 69.9 kg (03/20 0827) ?  ?General:   Alert,  Well-developed, well-nourished, pleasant and cooperative in NAD ?Lungs:  Clear throughout to auscultation.   ?Heart:  Regular rate and rhythm; no murmurs, clicks, rubs,  or gallops. ?Abdomen:  Soft, nontender and nondistended. Normal bowel sounds.   ?Neuro/Psych:  Alert and cooperative. Normal mood and affect. A and O x 3 ? ? ? ?No significant changes were identified.  The patient continues to be an appropriate  candidate for the planned procedure and anesthesia. ? ? ?Carmell Austria, MD. ?[facility] ?10/17/2021 9:49 AM@ ? ? ?

## 2021-10-17 NOTE — Anesthesia Procedure Notes (Signed)
Procedure Name: [place] ?Date/Time: 10/17/2021 10:01 AM ?Performed by: Claudia Desanctis, CRNA ?Pre-anesthesia Checklist: Patient identified, Emergency Drugs available, Suction available and Patient being monitored ?Patient Re-evaluated:Patient Re-evaluated prior to induction ?Oxygen Delivery Method: Simple face mask ? ? ? ? ?

## 2021-10-17 NOTE — Op Note (Signed)
[facility] ?Patient Name: Cathy Bruce ?Procedure Date: 10/17/2021 ?MRN: [id number] ?Attending MD: Jackquline Denmark , MD ?Date of Birth: 1961/03/08 ?CSN: [id number] ?Age: 61 ?Admit Type: Outpatient ?Procedure:                Colonoscopy ?Indications:              H/O colon polyps. ?Providers:                Jackquline Denmark, MD, Jaci Carrel, RN, Primitivo Gauze  ?                          [place], Technician, [employer],  ?                          Technician ?Referring MD:             Crist Infante, MD ?Medicines:                Monitored Anesthesia Care ?Complications:            No immediate complications. ?Estimated Blood Loss:     Estimated blood loss: none. ?Procedure:                Pre-Anesthesia Assessment: ?                          - Prior to the procedure, a History and Physical  ?                          was performed, and patient medications and  ?                          allergies were reviewed. The patient's tolerance of  ?                          previous anesthesia was also reviewed. The risks  ?                          and benefits of the procedure and the sedation  ?                          options and risks were discussed with the patient.  ?                          All questions were answered, and informed consent  ?                          was obtained. Prior Anticoagulants: The patient has  ?                          taken no previous anticoagulant or antiplatelet  ?                          agents. ASA Grade Assessment: II - A patient with  ?                          mild systemic disease.  After reviewing the risks  ?                          and benefits, the patient was deemed in  ?                          satisfactory condition to undergo the procedure. ?                          After obtaining informed consent, the colonoscope  ?                          was passed under direct vision. Throughout the  ?                          procedure, the patient's blood  pressure, pulse, and  ?                          oxygen saturations were monitored continuously. The  ?                          PCF-HQ190L ([id number]) Olympus colonoscope was  ?                          introduced through the anus and advanced to the 2  ?                          cm into the ileum. The colonoscopy was performed  ?                          without difficulty. The patient tolerated the  ?                          procedure well. The quality of the bowel  ?                          preparation was good. ?Scope In: 9:59:05 AM ?Scope Out: 10:09:02 AM ?Scope Withdrawal Time: 0 hours 6 minutes 18 seconds  ?Total Procedure Duration: 0 hours 9 minutes 57 seconds  ?Findings: ?     A few medium-mouthed diverticula were found in the sigmoid colon,  ?     transverse colon and ascending colon. Mild melanosis coli was noted  ?     throughout the colon. ?     Non-bleeding external and internal hemorrhoids were found during  ?     retroflexion and during perianal exam. The hemorrhoids were small. ?     The terminal ileum appeared normal. ?     The exam was otherwise without abnormality on direct and retroflexion  ?     views. ?Impression:               - Mild pancolonic diverticulosis. ?                          - Non-bleeding external and internal hemorrhoids. ?                          -  The examined portion of the ileum was normal. ?                          - The examination was otherwise normal on direct  ?                          and retroflexion views. ?                          - No specimens collected. ?Moderate Sedation: ?     Not Applicable - Patient had care per Anesthesia. ?Recommendation:           - Patient has a contact number available for  ?                          emergencies. The signs and symptoms of potential  ?                          delayed complications were discussed with the  ?                          patient. Return to normal activities tomorrow.  ?                          Written  discharge instructions were provided to the  ?                          patient. ?                          - High fiber diet. ?                          - Continue present medications. ?                          - Repeat colonoscopy in 10 years for screening  ?                          purposes. Earlier, if with any new problems or  ?                          change in family history. ?                          - The findings and recommendations were discussed  ?                          with the patient's husband. ?Procedure Code(s):        --- Professional --- ?                          F8182, Colorectal cancer screening; colonoscopy on  ?                          individual not meeting criteria for high  risk ?Diagnosis Code(s):        --- Professional --- ?                          Z12.11, Encounter for screening for malignant  ?                          neoplasm of colon ?                          K64.8, Other hemorrhoids ?                          K57.30, Diverticulosis of large intestine without  ?                          perforation or abscess without bleeding ?CPT copyright 2019 American Medical Association. All rights reserved. ?The codes documented in this report are preliminary and upon coder review may  ?be revised to meet current compliance requirements. ?Jackquline Denmark, MD ?10/17/2021 10:16:23 AM ?This report has been signed electronically. ?Number of Addenda: 0 ?

## 2021-10-17 NOTE — Anesthesia Preprocedure Evaluation (Signed)
Anesthesia Evaluation  ? ? ?Reviewed: ?Allergy & Precautions, Patient's Chart, lab work & pertinent test results ? ?History of Anesthesia Complications ?(+) DIFFICULT AIRWAY and history of anesthetic complications ? ?Airway ?Mallampati: III ? ?TM Distance: <3 FB ?Neck ROM: Full ? ? ? Dental ? ?(+) Teeth Intact, Dental Advisory Given ?  ?Pulmonary ?sleep apnea , pneumonia, resolved,  ?  ?breath sounds clear to auscultation ? ? ? ? ? ? Cardiovascular ?+ CAD  ?+ dysrhythmias  ?Rhythm:Regular Rate:Normal ? ? ?  ?Neuro/Psych ?PSYCHIATRIC DISORDERS Anxiety negative neurological ROS ?   ? GI/Hepatic ?Neg liver ROS, GERD  ,  ?Endo/Other  ?negative endocrine ROS ? Renal/GU ?negative Renal ROS  ? ?  ?Musculoskeletal ? ?(+) Arthritis ,  ? Abdominal ?Normal abdominal exam  (+)   ?Peds ? Hematology ?negative hematology ROS ?(+)   ?Anesthesia Other Findings ? ? Reproductive/Obstetrics ? ?  ? ? ? ? ? ? ? ? ? ? ? ? ? ?  ?  ? ? ? ? ? ? ? ? ?Anesthesia Physical ?Anesthesia Plan ? ?ASA: 2 ? ?Anesthesia Plan: MAC  ? ?Post-op Pain Management:   ? ?Induction: Intravenous ? ?PONV Risk Score and Plan: 0 and Propofol infusion ? ?Airway Management Planned: Natural Airway and Simple Face Mask ? ?Additional Equipment: None ? ?Intra-op Plan:  ? ?Post-operative Plan:  ? ?Informed Consent: I have reviewed the patients History and Physical, chart, labs and discussed the procedure including the risks, benefits and alternatives for the proposed anesthesia with the patient or authorized representative who has indicated his/her understanding and acceptance.  ? ? ? ? ? ?Plan Discussed with: CRNA ? ?Anesthesia Plan Comments:   ? ? ? ? ? ? ?Anesthesia Quick Evaluation ? ?

## 2021-10-17 NOTE — Discharge Instructions (Signed)
YOU HAD AN ENDOSCOPIC PROCEDURE TODAY: Refer to the procedure report and other information in the discharge instructions given to you for any specific questions about what was found during the examination. If this information does not answer your questions, please call [place] office at [phone] to clarify.  ° °YOU SHOULD EXPECT: Some feelings of bloating in the abdomen. Passage of more gas than usual. Walking can help get rid of the air that was put into your GI tract during the procedure and reduce the bloating. If you had a lower endoscopy (such as a colonoscopy or flexible sigmoidoscopy) you may notice spotting of blood in your stool or on the toilet paper. Some abdominal soreness may be present for a day or two, also. ° °DIET: Your first meal following the procedure should be a light meal and then it is ok to progress to your normal diet. A half-sandwich or bowl of soup is an example of a good first meal. Heavy or fried foods are harder to digest and may make you feel nauseous or bloated. Drink plenty of fluids but you should avoid alcoholic beverages for 24 hours. If you had a esophageal dilation, please see attached instructions for diet.   ° °ACTIVITY: Your care partner should take you home directly after the procedure. You should plan to take it easy, moving slowly for the rest of the day. You can resume normal activity the day after the procedure however YOU SHOULD NOT DRIVE, use power tools, machinery or perform tasks that involve climbing or major physical exertion for 24 hours (because of the sedation medicines used during the test).  ° °SYMPTOMS TO REPORT IMMEDIATELY: °A gastroenterologist can be reached at any hour. Please call [phone]  for any of the following symptoms:  °Following lower endoscopy (colonoscopy, flexible sigmoidoscopy) °Excessive amounts of blood in the stool  °Significant tenderness, worsening of abdominal pains  °Swelling of the abdomen that is new, acute  °Fever of 100° or  higher  °Following upper endoscopy (EGD, EUS, ERCP, esophageal dilation) °Vomiting of blood or coffee ground material  °New, significant abdominal pain  °New, significant chest pain or pain under the shoulder blades  °Painful or persistently difficult swallowing  °New shortness of breath  °Black, tarry-looking or red, bloody stools ° °FOLLOW UP:  °If any biopsies were taken you will be contacted by phone or by letter within the next 1-3 weeks. Call [phone]  if you have not heard about the biopsies in 3 weeks.  °Please also call with any specific questions about appointments or follow up tests. ° °

## 2021-10-17 NOTE — Transfer of Care (Signed)
Immediate Anesthesia Transfer of Care Note ? ?Patient: Cathy Bruce ? ?Procedure(s) Performed: COLONOSCOPY WITH PROPOFOL ? ?Patient Location: Endoscopy Unit ? ?Anesthesia Type:MAC ? ?Level of Consciousness: awake and patient cooperative ? ?Airway & Oxygen Therapy: Patient Spontanous Breathing and Patient connected to face mask ? ?Post-op Assessment: Report given to RN and Post -op Vital signs reviewed and stable ? ?Post vital signs: Reviewed and stable ? ?Last Vitals:  ?Vitals Value Taken Time  ?BP    ?Temp    ?Pulse 71 10/17/21 1014  ?Resp 13 10/17/21 1014  ?SpO2 100 % 10/17/21 1014  ?Vitals shown include unvalidated device data. ? ?Last Pain:  ?Vitals:  ? 10/17/21 0827  ?TempSrc: Temporal  ?PainSc: 0-No pain  ?   ? ?  ? ?Complications: No notable events documented. ?

## 2021-10-18 ENCOUNTER — Encounter (HOSPITAL_COMMUNITY): Payer: Self-pay | Admitting: Gastroenterology

## 2022-02-13 ENCOUNTER — Encounter: Payer: Self-pay | Admitting: Adult Health

## 2022-02-13 ENCOUNTER — Telehealth (INDEPENDENT_AMBULATORY_CARE_PROVIDER_SITE_OTHER): Payer: 59 | Admitting: Adult Health

## 2022-02-13 DIAGNOSIS — G4733 Obstructive sleep apnea (adult) (pediatric): Secondary | ICD-10-CM

## 2022-02-13 DIAGNOSIS — Z9989 Dependence on other enabling machines and devices: Secondary | ICD-10-CM

## 2022-02-13 NOTE — Progress Notes (Addendum)
[facility] [address] [place]. [place] [address] [phone]       OFFICE FOLLOW UP NOTE  [name] Date of Birth:  [birth year] Medical Record Number:  [id number]   Primary neurologist: Dr. Brett Fairy Reason for visit: Initial CPAP follow-up    PATIENT: Cathy Bruce DOB: 21-Jul-1961  REASON FOR VISIT: follow up HISTORY FROM: patient  Virtual Visit via [place] video visit  I connected with [name] on 02/13/22 at  3:00 PM EDT MyChart video visit and verified that I am speaking with the correct person using two identifiers. Patient is located at home, provider located in office.   I discussed the limitations, risks, security and privacy concerns of performing an evaluation and management service by video visit and the availability of in person appointments. I also discussed with the patient that there may be a patient responsible charge related to this service. The patient expressed understanding and agreed to proceed.    SUBJECTIVE:   CHIEF COMPLAINT:  CPAP follow-up   HPI:   Update 02/13/2022 JM: Patient returns for 35-monthCPAP follow-up via MyChart video visit. She continues to do well on CPAP. Believes she continues to benefit with use in regards to daytime energy and better sleeping quality.  Denies any difficulty tolerating. Getting supplies routinely from DME company. No new concerns at this time.        History provided for reference purposes only Update 08/09/2021 JM: returns for initial CPAP compliance visit. Followed by Dr. DBrett Fairy  Completed HST 09/2020 which showed severe sleep apnea with AHI 54.9/h and recommended initiating AutoPap. Review of compliance report shows excellent usage with optimal residual AHI. Doing well on CPAP - tolerates without difficulty. Reports improvement of day time fatigue and not taking daily daytime naps. Followed by [facility]. ESS 8. FSS 25.           Copied from Dr. DEdwena Feltyinitial consult visit 06/15/2020 Cathy BOZZAis a 61 year old Caucasian female patient and seen on 06/15/2020 upon CONSULTATION requested by dr. PJoylene Draft  Chief concern  :  When sleeping on her back she has struggled to breathe, felt palpitations, and her mouth trembling. Known history of bruxism, and uses a mouth guard.  = Cathy CHAMORROpresents with a possible sleep disorder. She  has a  medical history of ADD (attention deficit disorder), Atrial tachycardia ([place], Coronary artery disease, non-occlusive (06/2018), DDD (degenerative disc disease), lumbar, Diverticulosis, Dyslipidemia, GERD (gastroesophageal reflux disease), Internal hemorrhoids, Social anxiety disorder, and SVT (supraventricular tachycardia) ([place] , had 2 ablations with Dr ARayann Heman     Sleep relevant medical history: Nocturia 2-4 times, dream enactment, new- nasal rhinitis, bruxism and sinusitis.    Family medical /sleep history: mother  with OSA,  mother has dementia,  Social history:  Patient is retired and takes care of her grandchildren( 421of them)  and lives in a household with spouse, no pets, mother moved to assisted living. 2 adult children. Tobacco use- none.  ETOH use ; rarely - less than 2 /month. Caffeine intake in form of Soda( 3/ day)  and no energy drinks. Regular exercise in form of walking.      Sleep habits are as follows: The patient's dinner time is between 6-6.30 PM. The patient goes to bed at 12 PM- 1 AM,  And sometimes has trouble to fall asleep- once asleep she  continues to sleep for intervals of 2-6 hours , wakes for  bathroom breaks.   The preferred sleep position is on her side , with the support of 4 pillows. Bed is not adjusted. Back pain- pillows between her legs.  Dreams are reportedly frequent/vivid- sometimes acting out, yelling .  9-11 AM is the usual rise time. The patient wakes up spontaneously - 'not a morning person". .  She reports most  mornings  feeling refreshed and  restored in AM, with symptoms such as dry mouth- and eyes, and residual fatigue.Naps are taken frequently, lasting from 45-65 minutes on her couch, on her back- "breathing cutting off'  -these  are less refreshing than nocturnal sleep.         ROS:   14 system review of systems performed and negative with exception of no complaints     PMH:  Past Medical History:  Diagnosis Date   ADD (attention deficit disorder)    Atrial tachycardia [facility])    s/p ablation by Dr Caryl Comes in 2005   Coronary artery disease, non-occlusive 06/2018   High coronary calcium score: Coronary CTA shows heavily calcified proximal LAD lesion with moderate stenosis (FFR 0.95 across lesion, distal LAD FFR 0.82 -- not likely physiologic significant).  Mild disease elsewhere.   DDD (degenerative disc disease), lumbar    Difficult intubation    Diverticulosis    Dyslipidemia    As of October 2019: LDL P 1564.  Goal less than 100.   Dysrhythmia    GERD (gastroesophageal reflux disease)    Internal hemorrhoids    Pneumonia    Sleep apnea    uses CPAP   Social anxiety disorder    SVT (supraventricular tachycardia) (HCC)    Status post ablation by Dr. Rayann Heman in 2015    [place]:  Past Surgical History:  Procedure Laterality Date   ABDOMINAL HYSTERECTOMY     ABLATION OF DYSRHYTHMIC FOCUS  2005   atrial tachycardia ablated by Dr Caryl Comes   ABLATION OF DYSRHYTHMIC FOCUS  08/26/2013   atrial tachycardia ablated by Dr Rayann Heman, focus located along the tricuspid valve annulus   ATRIAL FIBRILLATION ABLATION N/A 08/26/2013   Procedure: [place];  Surgeon: Coralyn Mark, MD;  Location: [facility];  Service: Cardiovascular;  Laterality: N/A; ACTUALLY ATRIAL TACHYCARDIA   BACK SURGERY  01/2021   Lower back vertebral pad replacement, 2 screws in place   c-section  1997, 1988   x2   COLONOSCOPY  2017   Dr.Pyrtle   COLONOSCOPY WITH PROPOFOL N/A 10/17/2021   Procedure:  COLONOSCOPY WITH PROPOFOL;  Surgeon: Jackquline Denmark, MD;  Location: [facility];  Service: Endoscopy;  Laterality: N/A;   CYSTOSCOPY N/A 10/11/2021   Procedure: CYSTOSCOPY;  Surgeon: Robley Fries, MD;  Location[facility];  Service: Urology;  Laterality: N/A;   HEMORRHOID SURGERY  2002   PARTIAL HYSTERECTOMY  2000   SHOULDER SURGERY Right    TRANSTHORACIC ECHOCARDIOGRAM  07/2013   EF 55 to 60%.  GRII DD.  Mild AI, mild MR.   TRANSURETHRAL RESECTION OF BLADDER TUMOR N/A 10/11/2021   Procedure: TRANSURETHRAL RESECTION OF BLADDER TUMOR (TURBT) WITH URETHRAL DILATION;  Surgeon: Robley Fries, MD;  Location: [facility];  Service: Urology;  Laterality: N/A;   TUBAL LIGATION      Social History:  Social History   Socioeconomic History   Marital status: Married    Spouse name: Not on file   Number of children: 2   Years of education: Not on file   Highest education  level: Bachelor's degree (e.g., BA, AB, BS)  Occupational History   Occupation: Government social research officer    Comment: Retired: [employer]  Tobacco Use   Smoking status: Never   Smokeless tobacco: Never  Scientific laboratory technician Use: Never used  Substance and Sexual Activity   Alcohol use: Not Currently    Comment: occasional wine   Drug use: No   Sexual activity: Not on file  Other Topics Concern   Not on file  Social History Narrative   Lives in [place] with spouse.     They have 2 children (Abby and Saralyn Pilar, & one granddaughter named Festus Holts born in 2018.  Another granddaughter pending in February 2020).   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 she is now currently unemployed/retired.   She exercises 2 to 3 days a week for at least half an hour walking.  She also walks her dog just but every day several times a day.   Social Determinants of Radio broadcast assistant Strain: Not on file  Food Insecurity: Not on file  Transportation Needs: Not on  file  Physical Activity: Not on file  Stress: Not on file  Social Connections: Not on file  Intimate Partner Violence: Not on file    Family History:  Family History  Problem Relation Age of Onset   CAD Mother 88       CABG   Peripheral vascular disease Mother    Dementia Mother    Hypertension Mother    Heart disease Father    Lung cancer Father    Hypertension Father    Melanoma Sister    Healthy Brother    Healthy Brother    Heart disease Maternal Aunt    Heart attack Maternal Grandmother 50   CAD Maternal Grandmother 50       CABG at 48   Coronary artery disease Other    Colon cancer Cousin 52   Stomach cancer Neg Hx    Rectal cancer Neg Hx     Medications:   Current Outpatient Medications on File Prior to Visit  Medication Sig Dispense Refill   albuterol (VENTOLIN HFA) 108 (90 Base) MCG/ACT inhaler Inhale 1-2 puffs into the lungs every 6 (six) hours as needed for wheezing or shortness of breath.     Alirocumab (PRALUENT) 150 MG/ML SOAJ Take 150 mg by mouth every 14 (fourteen) days.     aspirin EC 81 MG tablet Take 81 mg by mouth every evening. Swallow whole.     Cholecalciferol (VITAMIN D3 PO) Take 1 tablet by mouth daily.     conjugated estrogens (PREMARIN) vaginal cream Place 1 Applicatorful vaginally 2 (two) times a week.     docusate sodium (COLACE) 100 MG capsule Take 1 capsule (100 mg total) by mouth 2 (two) times daily. 60 capsule 2   ezetimibe (ZETIA) 10 MG tablet Take 10 mg by mouth at bedtime.     FLUoxetine (PROZAC) 20 MG capsule Take 20 mg by mouth at bedtime.     ibuprofen (ADVIL) 200 MG tablet Take 400 mg by mouth every 6 (six) hours as needed.     metoprolol succinate (TOPROL-XL) 25 MG 24 hr tablet TAKE 1 TABLET BY MOUTH EVERY DAY 90 tablet 3   Multiple Vitamin (MULTIVITAMIN WITH MINERALS) TABS tablet Take 1 tablet by mouth daily. Women's One-A-Day     niacin 500 MG tablet Take 500 mg by mouth at bedtime.  nitroGLYCERIN (NITROSTAT) 0.4 MG SL tablet  Place 0.4 mg under the tongue every 5 (five) minutes as needed for chest pain.     Omega-3 Fatty Acids (FISH OIL PO) Take 2 capsules by mouth at bedtime.     omeprazole (PRILOSEC) 40 MG capsule Take 40 mg by mouth daily.     ondansetron (ZOFRAN) 4 MG tablet Take 1 tablet (4 mg total) by mouth every 6 (six) hours as needed for nausea or vomiting. 20 tablet 0   simvastatin (ZOCOR) 40 MG tablet Take 40 mg by mouth at bedtime.  3   traMADol (ULTRAM) 50 MG tablet Take 1 tablet (50 mg total) by mouth every 6 (six) hours as needed. 20 tablet 0   triamcinolone (KENALOG) 0.1 % paste Use as directed 1 application in the mouth or throat 2 (two) times daily as needed (sores).     No current facility-administered medications on file prior to visit.    Allergies:  No Known Allergies    OBJECTIVE: N/A d/t visit type      ASSESSMENT/PLAN: Cathy Bruce is a 61 y.o. year old female with severe sleep apnea dx'd 09/2020 followed by Dr. Brett Fairy.  AutoPAP initiated 04/2021.  Patient seen today for CPAP compliance follow-up visit     OSA on CPAP Compliance report shows satisfactory usage with optimal residual AHI.  Discussed continued nightly usage with ensuring greater than 4 hours nightly for optimal benefit and per insurance purposes.  Continue to follow with DME company for any needed supplies or CPAP related concerns     Follow up in 1 year via MyChart VV or call earlier if needed   CC:  PCP: Crist Infante, MD    I spent 20 minutes of face-to-face and non-face-to-face time with patient.  This included previsit chart review, lab review, study review, order entry, electronic health record documentation, patient education regarding sleep apnea on CPAP, review and discussion of CPAP compliance report, importance of nightly CPAP usage and answered all other questions to patient satisfaction   Frann Rider, AGNP-BC  [facility] [address] [place], [place]  [address]  Phone [phone] Fax [phone] Note: This document was prepared with digital dictation and possible smart phrase technology. Any transcriptional errors that result from this process are unintentional.

## 2022-02-13 NOTE — Patient Instructions (Signed)
No changes today -please continue current nightly use of CPAP  Continue to follow with your DME company for any needed supplies or CPAP related concerns     Follow-up in 1 year or call earlier if needed

## 2022-02-28 ENCOUNTER — Encounter: Payer: Self-pay | Admitting: Cardiology

## 2022-03-01 NOTE — Telephone Encounter (Signed)
LMTCB

## 2022-06-14 ENCOUNTER — Other Ambulatory Visit: Payer: Self-pay | Admitting: Cardiology

## 2022-07-11 ENCOUNTER — Ambulatory Visit: Payer: 59 | Admitting: Cardiology

## 2022-11-14 ENCOUNTER — Ambulatory Visit: Payer: 59 | Attending: Cardiology | Admitting: Cardiology

## 2022-11-14 VITALS — BP 120/76 | HR 77 | Ht 64.0 in | Wt 165.0 lb

## 2022-11-14 DIAGNOSIS — R931 Abnormal findings on diagnostic imaging of heart and coronary circulation: Secondary | ICD-10-CM

## 2022-11-14 DIAGNOSIS — I4719 Other supraventricular tachycardia: Secondary | ICD-10-CM

## 2022-11-14 DIAGNOSIS — R000 Tachycardia, unspecified: Secondary | ICD-10-CM

## 2022-11-14 DIAGNOSIS — I2510 Atherosclerotic heart disease of native coronary artery without angina pectoris: Secondary | ICD-10-CM

## 2022-11-14 DIAGNOSIS — E785 Hyperlipidemia, unspecified: Secondary | ICD-10-CM

## 2022-11-14 DIAGNOSIS — I471 Supraventricular tachycardia, unspecified: Secondary | ICD-10-CM

## 2022-11-14 NOTE — Progress Notes (Signed)
Primary Care Provider: Rodrigo Ran, MD [facility] Cardiologist: Cathy Lemma, MD Electrophysiologist: None  Clinic Note: Chief Complaint  Patient presents with   Follow-up    2 and half years.  Doing well.  Just checking in   Coronary Artery Disease    Nonobstructive disease on coronary CTA.  No active symptoms.  On stable regimen.   ===================================  ASSESSMENT/PLAN   Problem List Items Addressed This Visit       Cardiology Problems   Hyperlipidemia with target LDL less than 70 (Chronic)    Target LDL less than 70 if not less than 55.  Is on combination of simvastatin, Zetia and Praluent with well-controlled labs.  Labs followed by PCP.  Continue to follow.      Coronary artery disease, non-occlusive (Chronic)    Moderate LA disease on Coronary CTA.  On stable dose of aspirin, statin, Zetia, Praluent and Toprol.  Target LDL less than 70 if not less than 55.  BP and glycemic control Continue to stay active and exercise to evaluate for symptoms.      Relevant Orders   EKG 12-Lead (Completed)   Atrial Bruce (HCC) (Chronic)    History of ablation x 2.  No breakthrough episodes.  Still on low-dose Toprol.      Relevant Orders   EKG 12-Lead (Completed)   Agatston coronary artery calcium score between 200 and 399 - Primary (Chronic)    No high risk findings from a obstructive standpoint, but this does put her at advanced risk and warrants aggressive management. Targeting LDL less than 70 closer to 55. BP control  Plan: On combination of simvastatin, Zetia and Praluent-last LDL was 22 back in October.  We can probably get rid of Zetia. On aspirin and low-dose beta-blocker Continue to stay active with exercising.      Relevant Orders   EKG 12-Lead (Completed)     Other   Sinus Bruce (Chronic)    Heart rate better controlled on Toprol.      Relevant Orders   EKG 12-Lead (Completed)     ===================================  HPI:    Cathy Bruce is a 62 y.o. female with a PMH below who presents today for 2-1/2-year follow-up at the request of Cathy Ran, MD.  Cathy Bruce s/p ablation 2005,  Elevated CORONARY CALCIUM SCORE with nonobstructive CAD by CORONARY CTA in December 2019  Hyperlipidemia   I last saw Cathy Bruce on almost 06/19/2020-doing fairly well.  Has been doing well with her dietary changes and increasing exercise.  Was tolerating simvastatin and Zetia as well as Praluent.  Only noted some exertional dyspnea if she rests up a couple flights steps or carrying groceries up steps quickly.  No dyspnea with routine activity or walking on flat.  No real palpitations.  Recent Hospitalizations:    Reviewed  CV studies:    The following studies were reviewed today: (if available, images/films reviewed: From Epic Chart or Care Everywhere) CorCTA 06/2018:  Heabily calcified pLAD - moderate stenosis (appears > 70%).  FFR 0.95, 0.82 distal (Not significant). Cx (p 0.98 - d 0.96). RCA (p 0.99, m 0.96, d 0.92);  =>  Given heavily calcified proximal LAD with at least moderate stenosis, recommend aggressive risk factor modification including aspirin and high potency statin  Interval History:   Cathy Bruce returns here today overall doing fairly well.  She just felt like she needed to stay in touch.  She is not having any chest  pain/pressure or dyspnea with rest or exertion.  She is try to stay active.  No PND, orthopnea or edema. She has recovered from her back surgery but still has some leg cramping at night and her legs get cold. Overall pretty asymptomatic regarding standpoint.  CV Review of Symptoms (Summary): no chest pain or dyspnea on exertion positive for - legs ache, but not with exertion. negative for - edema, irregular heartbeat, orthopnea, palpitations, paroxysmal nocturnal dyspnea, rapid heart rate, shortness of breath, or  lightheadedness or dizziness, syncope/near syncope or TIA/amaurosis fugax, claudication.  REVIEWED OF SYSTEMS   Review of Systems  Constitutional:  Negative for malaise/fatigue and weight loss.  HENT:  Negative for congestion.   Respiratory:  Negative for cough.   Gastrointestinal:  Negative for abdominal pain, blood in stool and melena.  Genitourinary:  Positive for hematuria.  Musculoskeletal:  Positive for back pain and joint pain.  Neurological:  Positive for tingling (Some mild neuropathy pains). Negative for dizziness.  Endo/Heme/Allergies:  Does not bruise/bleed easily.  Psychiatric/Behavioral:  Negative for memory loss. The patient is not nervous/anxious and does not have insomnia.     I have reviewed and (if needed) personally updated the patient's problem list, medications, allergies, past medical and surgical history, social and family history.   PAST MEDICAL HISTORY   Past Medical History:  Diagnosis Date   ADD (attention deficit disorder)    Atrial Bruce    s/p ablation by Dr Cathy Bruce in 2005   Coronary artery disease, non-occlusive 06/2018   High coronary calcium score: Coronary CTA shows heavily calcified proximal LAD lesion with moderate stenosis (FFR 0.95 across lesion, distal LAD FFR 0.82 -- not likely physiologic significant).  Mild disease elsewhere.   DDD (degenerative disc disease), lumbar    Difficult intubation    Diverticulosis    Dyslipidemia    As of October 2019: LDL P 1564.  Goal less than 100.   Dysrhythmia    GERD (gastroesophageal reflux disease)    Internal hemorrhoids    Pneumonia    Sleep apnea    uses CPAP   Social anxiety disorder    SVT (supraventricular Bruce)    Status post ablation by Dr. Johney Bruce in 2015    PAST SURGICAL HISTORY   Past Surgical History:  Procedure Laterality Date   ABDOMINAL HYSTERECTOMY     ABLATION OF DYSRHYTHMIC FOCUS  2005   atrial Bruce ablated by Dr Cathy Bruce   ABLATION OF DYSRHYTHMIC FOCUS   08/26/2013   atrial Bruce ablated by Dr Cathy Bruce, focus located along the tricuspid valve annulus   ATRIAL FIBRILLATION ABLATION N/A 08/26/2013   Procedure: ATRIAL FIBRILLATION ABLATION;  Surgeon: Cathy Rhyme, MD;  Location: [facility];  Service: Cardiovascular;  Laterality: N/A; ACTUALLY ATRIAL Bruce   BACK SURGERY  01/2021   Lower back vertebral pad replacement, 2 screws in place   c-section  1997, 1988   x2   COLONOSCOPY  2017   Dr.Pyrtle   COLONOSCOPY WITH PROPOFOL N/A 10/17/2021   Procedure: COLONOSCOPY WITH PROPOFOL;  Surgeon: Lynann Bologna, MD;  Location: [facility];  Service: Endoscopy;  Laterality: N/A;   CYSTOSCOPY N/A 10/11/2021   Procedure: CYSTOSCOPY;  Surgeon: Noel Christmas, MD;  Location: [facility];  Service: Urology;  Laterality: N/A;   HEMORRHOID SURGERY  2002   PARTIAL HYSTERECTOMY  2000   SHOULDER SURGERY Right    TRANSTHORACIC ECHOCARDIOGRAM  07/2013   EF 55 to 60%.  GRII DD.  Mild AI, mild  MR.   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  MEDICATIONS/ALLERGIES   Current Meds  Medication Sig   albuterol (VENTOLIN HFA) 108 (90 Base) MCG/ACT inhaler Inhale 1-2 puffs into the lungs every 6 (six) hours as needed for wheezing or shortness of breath.   Alirocumab (PRALUENT) 150 MG/ML SOAJ Take 150 mg by mouth every 14 (fourteen) days.   aspirin EC 81 MG tablet Take 81 mg by mouth every evening. Swallow whole.   Cholecalciferol (VITAMIN D3 PO) Take 1 tablet by mouth daily.   conjugated estrogens (PREMARIN) vaginal cream Place 1 Applicatorful vaginally 2 (two) times a week.   docusate sodium (COLACE) 100 MG capsule Take 1 capsule (100 mg total) by  mouth 2 (two) times daily.   ezetimibe (ZETIA) 10 MG tablet Take 10 mg by mouth at bedtime.   FLUoxetine (PROZAC) 20 MG capsule Take 20 mg by mouth at bedtime.   ibuprofen (ADVIL) 200 MG tablet Take 400 mg by mouth every 6 (six) hours as needed.   metoprolol succinate (TOPROL-XL) 25 MG 24 hr tablet TAKE 1 TABLET BY MOUTH EVERY DAY   Multiple Vitamin (MULTIVITAMIN WITH MINERALS) TABS tablet Take 1 tablet by mouth daily. Women's One-A-Day   niacin 500 MG tablet Take 500 mg by mouth at bedtime.   Omega-3 Fatty Acids (FISH OIL PO) Take 2 capsules by mouth at bedtime.   omeprazole (PRILOSEC) 40 MG capsule Take 40 mg by mouth daily.   ondansetron (ZOFRAN) 4 MG tablet Take 1 tablet (4 mg total) by mouth every 6 (six) hours as needed for nausea or vomiting.   simvastatin (ZOCOR) 40 MG tablet Take 40 mg by mouth at bedtime.   triamcinolone (KENALOG) 0.1 % paste Use as directed 1 application in the mouth or throat 2 (two) times daily as needed (sores).   No Known Allergies  SOCIAL HISTORY/FAMILY HISTORY   Reviewed in Epic:  Pertinent findings:  Social History   Tobacco Use   Smoking status: Never   Smokeless tobacco: Never  Vaping Use   Vaping Use: Never used  Substance Use Topics   Alcohol use: Not Currently    Comment: occasional wine   Drug use: No   Social History   Social History Narrative   Lives in [place] with spouse.     They have 2 children (Abby and Luisa Hart, & one granddaughter named Samson Frederic born in 2018.  Another granddaughter pending in February 2020).   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 she is now currently unemployed/retired.   She exercises 2 to 3 days a week for at least half an hour walking.  She also walks her dog just but every day several times a day.    OBJCTIVE -PE, EKG, labs   Wt Readings from Last 3 Encounters:  11/14/22 165 lb (74.8 kg)  10/17/21 154 lb (69.9 kg)  10/11/21 157 lb 9.6 oz (71.5 kg)    Physical Exam: BP  120/76   Pulse 77   Ht 5\' 4"  (1.626 m)   Wt 165 lb (74.8 kg)   SpO2 97%   BMI 28.32 kg/m  Physical Exam Vitals reviewed.  Constitutional:      General:  She is not in acute distress.    Appearance: Normal appearance. She is normal weight. She is not ill-appearing or toxic-appearing.  HENT:     Head: Normocephalic and atraumatic.  Neck:     Vascular: No carotid bruit or JVD.  Cardiovascular:     Rate and Rhythm: Normal rate and regular rhythm. No extrasystoles are present.    Chest Wall: PMI is not displaced.     Pulses: Normal pulses.     Heart sounds: Murmur (1/6 SEM at RUSB.) heard.     No friction rub. No gallop.  Pulmonary:     Effort: Pulmonary effort is normal. No respiratory distress.     Breath sounds: No wheezing, rhonchi or rales.  Musculoskeletal:        General: No swelling. Normal range of motion.     Cervical back: Normal range of motion and neck supple.  Skin:    General: Skin is warm and dry.  Neurological:     General: No focal deficit present.     Mental Status: She is alert and oriented to person, place, and time.     Gait: Gait normal.  Psychiatric:        Mood and Affect: Mood normal.        Behavior: Behavior normal.        Thought Content: Thought content normal.        Judgment: Judgment normal.     Adult ECG Report  Rate: 77 ;  Rhythm: normal sinus rhythm and low voltage.  Otherwise normal axis, intervals and durations. ;   Narrative Interpretation: Stable  Recent Labs: Reviewed.  Due for labs to recheck soon 05/09/2022: A1c 5.2; TC 103, TG 167, HDL, LDL 22.Marland Kitchen  TSH 1.62 No results found for: "CHOL", "HDL", "LDLCALC", "LDLDIRECT", "TRIG", "CHOLHDL" Lab Results  Component Value Date   CREATININE 0.70 10/11/2021   BUN 10 10/11/2021   NA 143 10/11/2021   K 3.8 10/11/2021   CL 106 10/11/2021   CO2 27 02/24/2021      Latest Ref Rng & Units 10/11/2021   10:08 AM 02/24/2021    4:19 AM 02/21/2021    3:47 PM  CBC  WBC 4.0 - 10.5 K/uL  11.6  8.0    Hemoglobin 12.0 - 15.0 g/dL 95.6  21.3  08.6   Hematocrit 36.0 - 46.0 % 36.0  30.6  40.6   Platelets 150 - 400 K/uL  199  278     No results found for: "HGBA1C" No results found for: "TSH"  ================================================== I spent a total of 20 minutes with the patient spent in direct patient consultation.  Additional time spent with chart review  / charting (studies, outside notes, etc): 14 min Total Time: 34 min  Current medicines are reviewed at length with the patient today.  (+/- concerns) N/A  Notice: This dictation was prepared with Dragon dictation along with smart phrase technology. Any transcriptional errors that result from this process are unintentional and may not be corrected upon review.  Studies Ordered:   Orders Placed This Encounter  Procedures   EKG 12-Lead   No orders of the defined types were placed in this encounter.   Patient Instructions / Medication Changes & Studies & Tests Ordered   Patient Instructions  Medication Instructions:   No changes  *If you need a refill on your cardiac medications before your next appointment, please call your pharmacy*   Lab Work: Not needed If you have labs (blood work) drawn  today and your tests are completely normal, you will receive your results only by: MyChart Message (if you have MyChart) OR A paper copy in the mail If you have any lab test that is abnormal or we need to change your treatment, we will call you to review the results.   Testing/Procedures:  Not needed  Follow-Up: At [facility], you and your health needs are our priority.  As part of our continuing mission to provide you with exceptional heart care, we have created designated Provider Care Teams.  These Care Teams include your primary Cardiologist (physician) and Advanced Practice Providers (APPs -  Physician Assistants and Nurse Practitioners) who all work together to provide you with the care you need, when you need  it.     Your next appointment:   24 month(s)  The format for your next appointment:   In Person  Provider:       Marykay Lex, MD, MS Cathy Bruce, M.D., M.S. Interventional Cardiologist  [facility]  Pager # [phone] Phone # [phone] [address] [place], [place] [address]   Thank you for choosing [facility]!!

## 2022-11-14 NOTE — Patient Instructions (Signed)
Medication Instructions:   No changes  *If you need a refill on your cardiac medications before your next appointment, please call your pharmacy*   Lab Work: Not needed If you have labs (blood work) drawn today and your tests are completely normal, you will receive your results only by: MyChart Message (if you have MyChart) OR A paper copy in the mail If you have any lab test that is abnormal or we need to change your treatment, we will call you to review the results.   Testing/Procedures:  Not needed  Follow-Up: At [facility], you and your health needs are our priority.  As part of our continuing mission to provide you with exceptional heart care, we have created designated Provider Care Teams.  These Care Teams include your primary Cardiologist (physician) and Advanced Practice Providers (APPs -  Physician Assistants and Nurse Practitioners) who all work together to provide you with the care you need, when you need it.     Your next appointment:   24 month(s)  The format for your next appointment:   In Person  Provider:

## 2022-11-17 ENCOUNTER — Other Ambulatory Visit: Payer: Self-pay

## 2022-11-17 ENCOUNTER — Telehealth: Payer: Self-pay | Admitting: Cardiology

## 2022-11-17 NOTE — Telephone Encounter (Signed)
*  STAT* If patient is at the pharmacy, call can be transferred to refill team.   1. Which medications need to be refilled? (please list name of each medication and dose if known) metoprolol succinate (TOPROL-XL) 25 MG 24 hr tablet   nitroGLYCERIN (NITROSTAT) 0.4 MG SL tablet    2. Which pharmacy/location (including street and city if local pharmacy) is medication to be sent to? [id number] - [place], [place] - [address]   3. Do they need a 30 day or 90 day supply? 90 day

## 2022-11-26 ENCOUNTER — Encounter: Payer: Self-pay | Admitting: Cardiology

## 2022-11-26 NOTE — Assessment & Plan Note (Signed)
Target LDL less than 70 if not less than 55.  Is on combination of simvastatin, Zetia and Praluent with well-controlled labs.  Labs followed by PCP.  Continue to follow.

## 2022-11-26 NOTE — Assessment & Plan Note (Signed)
No high risk findings from a obstructive standpoint, but this does put her at advanced risk and warrants aggressive management. Targeting LDL less than 70 closer to 55. BP control  Plan: On combination of simvastatin, Zetia and Praluent-last LDL was 22 back in October.  We can probably get rid of Zetia. On aspirin and low-dose beta-blocker Continue to stay active with exercising.

## 2022-11-26 NOTE — Assessment & Plan Note (Signed)
Moderate LA disease on Coronary CTA.  On stable dose of aspirin, statin, Zetia, Praluent and Toprol.  Target LDL less than 70 if not less than 55.  BP and glycemic control Continue to stay active and exercise to evaluate for symptoms.

## 2022-11-26 NOTE — Assessment & Plan Note (Signed)
History of ablation x 2.  No breakthrough episodes.  Still on low-dose Toprol.

## 2022-11-26 NOTE — Assessment & Plan Note (Signed)
Heart rate better controlled on Toprol.

## 2023-01-09 ENCOUNTER — Other Ambulatory Visit: Payer: Self-pay

## 2023-01-09 MED ORDER — WEGOVY 0.25 MG/0.5ML ~~LOC~~ SOAJ
0.2500 mg | SUBCUTANEOUS | 0 refills | Status: DC
  Filled 2023-01-09 – 2023-01-17 (×2): qty 2, 28d supply, fill #0

## 2023-01-12 ENCOUNTER — Other Ambulatory Visit (HOSPITAL_COMMUNITY): Payer: Self-pay

## 2023-01-15 ENCOUNTER — Other Ambulatory Visit (HOSPITAL_COMMUNITY): Payer: Self-pay

## 2023-01-17 ENCOUNTER — Other Ambulatory Visit (HOSPITAL_COMMUNITY): Payer: Self-pay

## 2023-01-17 ENCOUNTER — Other Ambulatory Visit: Payer: Self-pay

## 2023-02-14 ENCOUNTER — Other Ambulatory Visit (HOSPITAL_COMMUNITY): Payer: Self-pay

## 2023-02-19 ENCOUNTER — Encounter: Payer: Self-pay | Admitting: Adult Health

## 2023-02-19 ENCOUNTER — Other Ambulatory Visit (HOSPITAL_COMMUNITY): Payer: Self-pay

## 2023-02-19 ENCOUNTER — Telehealth: Payer: 59 | Admitting: Adult Health

## 2023-02-19 DIAGNOSIS — G4733 Obstructive sleep apnea (adult) (pediatric): Secondary | ICD-10-CM

## 2023-02-19 NOTE — Progress Notes (Signed)
[facility] [address] [place]. [place] [address] [phone]       OFFICE FOLLOW UP NOTE  [name] Date of Birth:  1961/05/13 Medical Record Number:  [id number]   Primary neurologist: Dr. Vickey Huger Reason for visit: Initial CPAP follow-up    PATIENT: Cathy Bruce DOB: 04/19/1961  REASON FOR VISIT: follow up HISTORY FROM: patient  Virtual Visit via Video Note  I connected with [name] on 02/19/23 at  3:45 PM EDT by a video enabled telemedicine application and verified that I am speaking with the correct person using two identifiers.  Location: Patient: at home Provider: in office   I discussed the limitations of evaluation and management by telemedicine and the availability of in person appointments. The patient expressed understanding and agreed to proceed.    SUBJECTIVE:   CHIEF COMPLAINT:  CPAP follow-up   Brief HPI:   Cathy Bruce is a 62 y.o. female who was initially seen by Dr. Vickey Huger in 05/2020 for concern of underlying sleep apnea. Completed HST 09/2020 which showed severe sleep apnea with AHI 54.9/h and recommended initiating AutoPap which was started in 04/2021.   At prior visit 02/13/2022, compliance report showed adequate usage and optimal residual AHI.  Continues to tolerate CPAP well.   Interval history:  Compliance report as below shows excellent usage and optimal residual AHI.  She continues to tolerate her CPAP well. Continues to see great benefit in regards to sleep quality. Will occasionally nap without CPAP and notices a difference.  Remains up to date on supplies.  No questions or concerns at this time.            ROS:   14 system review of systems performed and negative with exception of no complaints     PMH:  Past Medical History:  Diagnosis Date   ADD (attention deficit disorder)    Atrial tachycardia    s/p ablation by Dr Graciela Husbands in 2005   Coronary artery disease,  non-occlusive 06/2018   High coronary calcium score: Coronary CTA shows heavily calcified proximal LAD lesion with moderate stenosis (FFR 0.95 across lesion, distal LAD FFR 0.82 -- not likely physiologic significant).  Mild disease elsewhere.   DDD (degenerative disc disease), lumbar    Difficult intubation    Diverticulosis    Dyslipidemia    As of October 2019: LDL P 1564.  Goal less than 100.   Dysrhythmia    GERD (gastroesophageal reflux disease)    Internal hemorrhoids    Pneumonia    Sleep apnea    uses CPAP   Social anxiety disorder    SVT (supraventricular tachycardia)    Status post ablation by Dr. Johney Frame in 2015    PSH:  Past Surgical History:  Procedure Laterality Date   ABDOMINAL HYSTERECTOMY     ABLATION OF DYSRHYTHMIC FOCUS  2005   atrial tachycardia ablated by Dr Graciela Husbands   ABLATION OF DYSRHYTHMIC FOCUS  08/26/2013   atrial tachycardia ablated by Dr Johney Frame, focus located along the tricuspid valve annulus   ATRIAL FIBRILLATION ABLATION N/A 08/26/2013   Procedure: ATRIAL FIBRILLATION ABLATION;  Surgeon: Gardiner Rhyme, MD;  Location: [facility];  Service: Cardiovascular;  Laterality: N/A; ACTUALLY ATRIAL TACHYCARDIA   BACK SURGERY  01/2021   Lower back vertebral pad replacement, 2 screws in place   c-section  1997, 1988   x2   COLONOSCOPY  2017   Dr.Pyrtle   COLONOSCOPY WITH PROPOFOL N/A 10/17/2021  Procedure: COLONOSCOPY WITH PROPOFOL;  Surgeon: Lynann Bologna, MD;  Location: [facility];  Service: Endoscopy;  Laterality: N/A;   CYSTOSCOPY N/A 10/11/2021   Procedure: CYSTOSCOPY;  Surgeon: Noel Christmas, MD;  Location: [facility];  Service: Urology;  Laterality: N/A;   HEMORRHOID SURGERY  2002   PARTIAL HYSTERECTOMY  2000   SHOULDER SURGERY Right    TRANSTHORACIC ECHOCARDIOGRAM  07/2013   EF 55 to 60%.  GRII DD.  Mild AI, mild MR.   TRANSURETHRAL RESECTION OF BLADDER TUMOR N/A 10/11/2021   Procedure: TRANSURETHRAL RESECTION OF BLADDER TUMOR  (TURBT) WITH URETHRAL DILATION;  Surgeon: Noel Christmas, MD;  Location: [facility];  Service: Urology;  Laterality: N/A;   TUBAL LIGATION      Social History:  Social History   Socioeconomic History   Marital status: Married    Spouse name: Not on file   Number of children: 2   Years of education: Not on file   Highest education level: Bachelor's degree (e.g., BA, AB, BS)  Occupational History   Occupation: Emergency planning/management officer    Comment: Retired: [employer]  Tobacco Use   Smoking status: Never   Smokeless tobacco: Never  Vaping Use   Vaping status: Never Used  Substance and Sexual Activity   Alcohol use: Not Currently    Comment: occasional wine   Drug use: No   Sexual activity: Not on file  Other Topics Concern   Not on file  Social History Narrative   Lives in [place] with spouse.     They have 2 children (Abby and Luisa Hart, & one granddaughter named Samson Frederic born in 2018.  Another granddaughter pending in February 2020).   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 she is now currently unemployed/retired.   She exercises 2 to 3 days a week for at least half an hour walking.  She also walks her dog just but every day several times a day.   Social Determinants of Corporate investment banker Strain: Not on file  Food Insecurity: Not on file  Transportation Needs: Not on file  Physical Activity: Not on file  Stress: Not on file  Social Connections: Not on file  Intimate Partner Violence: Not on file    Family History:  Family History  Problem Relation Age of Onset   CAD Mother 3       CABG   Peripheral vascular disease Mother    Dementia Mother    Hypertension Mother    Heart disease Father    Lung cancer Father    Hypertension Father    Melanoma Sister    Healthy Brother    Healthy Brother    Heart disease Maternal Aunt    Heart attack Maternal Grandmother 50   CAD Maternal Grandmother 86       CABG at  18   Coronary artery disease Other    Colon cancer Cousin 52   Stomach cancer Neg Hx    Rectal cancer Neg Hx     Medications:   Current Outpatient Medications on File Prior to Visit  Medication Sig Dispense Refill   albuterol (VENTOLIN HFA) 108 (90 Base) MCG/ACT inhaler Inhale 1-2 puffs into the lungs every 6 (six) hours as needed for wheezing or shortness of breath.     Alirocumab (PRALUENT) 150 MG/ML SOAJ Take 150 mg by mouth every 14 (fourteen) days.     aspirin EC 81 MG tablet Take  81 mg by mouth every evening. Swallow whole.     Cholecalciferol (VITAMIN D3 PO) Take 1 tablet by mouth daily.     conjugated estrogens (PREMARIN) vaginal cream Place 1 Applicatorful vaginally 2 (two) times a week.     docusate sodium (COLACE) 100 MG capsule Take 1 capsule (100 mg total) by mouth 2 (two) times daily. 60 capsule 2   ezetimibe (ZETIA) 10 MG tablet Take 10 mg by mouth at bedtime.     FLUoxetine (PROZAC) 20 MG capsule Take 20 mg by mouth at bedtime.     ibuprofen (ADVIL) 200 MG tablet Take 400 mg by mouth every 6 (six) hours as needed.     metoprolol succinate (TOPROL-XL) 25 MG 24 hr tablet TAKE 1 TABLET BY MOUTH EVERY DAY 90 tablet 3   Multiple Vitamin (MULTIVITAMIN WITH MINERALS) TABS tablet Take 1 tablet by mouth daily. Women's One-A-Day     niacin 500 MG tablet Take 500 mg by mouth at bedtime.     nitroGLYCERIN (NITROSTAT) 0.4 MG SL tablet Place 0.4 mg under the tongue every 5 (five) minutes as needed for chest pain. (Patient not taking: Reported on 11/14/2022)     Omega-3 Fatty Acids (FISH OIL PO) Take 2 capsules by mouth at bedtime.     omeprazole (PRILOSEC) 40 MG capsule Take 40 mg by mouth daily.     ondansetron (ZOFRAN) 4 MG tablet Take 1 tablet (4 mg total) by mouth every 6 (six) hours as needed for nausea or vomiting. 20 tablet 0   Semaglutide-Weight Management (WEGOVY) 0.25 MG/0.5ML SOAJ Inject 0.25 mg into the skin once a week for 4 weeks, then increase to 0.5mg . 2 mL 0   simvastatin  (ZOCOR) 40 MG tablet Take 40 mg by mouth at bedtime.  3   triamcinolone (KENALOG) 0.1 % paste Use as directed 1 application in the mouth or throat 2 (two) times daily as needed (sores).     No current facility-administered medications on file prior to visit.    Allergies:  No Known Allergies    OBJECTIVE: N/A d/t visit type      ASSESSMENT/PLAN: TULANI KIDNEY is a 62 y.o. year old female with severe sleep apnea dx'd 09/2020 followed by Dr. Vickey Huger.  AutoPAP initiated 04/2021.  Patient seen today for CPAP compliance follow-up visit     OSA on CPAP Compliance report shows satisfactory usage with optimal residual AHI.  Continue current pressure settings.  Discussed continued nightly usage with ensuring greater than 4 hours nightly for optimal benefit and per insurance purposes.  Continue to follow with DME company for any needed supplies or CPAP related concerns     Follow up in 1 year via MyChart VV or call earlier if needed   CC:  PCP: Rodrigo Ran, MD    I spent 17 minutes of face-to-face and non-face-to-face time with patient via MyChart video visit.  This included previsit chart review, lab review, study review, order entry, electronic health record documentation, patient education and discussion regarding above diagnoses and treatment plan and answered all other questions to patient's satisfaction   Ihor Austin, [facility]  [facility] [address] [place], [place] [address]  Phone [phone] Fax [phone] Note: This document was prepared with digital dictation and possible smart phrase technology. Any transcriptional errors that result from this process are unintentional.

## 2023-02-20 ENCOUNTER — Other Ambulatory Visit: Payer: Self-pay

## 2023-02-20 ENCOUNTER — Other Ambulatory Visit (HOSPITAL_COMMUNITY): Payer: Self-pay

## 2023-02-20 MED ORDER — SEMAGLUTIDE-WEIGHT MANAGEMENT 0.5 MG/0.5ML ~~LOC~~ SOAJ
0.5000 mg | SUBCUTANEOUS | 0 refills | Status: DC
  Filled 2023-02-20: qty 2, 28d supply, fill #0

## 2023-03-07 ENCOUNTER — Encounter: Payer: Self-pay | Admitting: Allergy

## 2023-03-07 ENCOUNTER — Other Ambulatory Visit: Payer: Self-pay

## 2023-03-07 ENCOUNTER — Ambulatory Visit (INDEPENDENT_AMBULATORY_CARE_PROVIDER_SITE_OTHER): Payer: 59 | Admitting: Allergy

## 2023-03-07 VITALS — BP 122/70 | HR 78 | Temp 98.0°F | Resp 16 | Ht 64.0 in | Wt 168.7 lb

## 2023-03-07 DIAGNOSIS — J4520 Mild intermittent asthma, uncomplicated: Secondary | ICD-10-CM | POA: Diagnosis not present

## 2023-03-07 DIAGNOSIS — H1013 Acute atopic conjunctivitis, bilateral: Secondary | ICD-10-CM | POA: Diagnosis not present

## 2023-03-07 DIAGNOSIS — J310 Chronic rhinitis: Secondary | ICD-10-CM

## 2023-03-07 DIAGNOSIS — H109 Unspecified conjunctivitis: Secondary | ICD-10-CM

## 2023-03-07 NOTE — Patient Instructions (Addendum)
-   Neb machine and teaching provided. - Daily controller medication(s): none at this time - Prior to physical activity if needed: albuterol 2 puffs or Airsupra 10-15 minutes before physical activity. - Rescue medications: Airsupra 2 puffs OR albuterol 2 puffs OR albuterol 1 vial via nebulizer every 4-6 hours as needed Paulene Floor is a rescue inhaler with albuterol + budesonide in pump; this inhaler is superior to albuterol alone and usually provides longer lasting benefit vs albuterol alone.  Airsupra sample provided and have pharmacy run the copay assistance  - Breathing control goals:  * Full participation in all desired activities (may need albuterol before activity) * Albuterol use two time or less a week on average (not counting use with activity) * Cough interfering with sleep two time or less a month * Oral steroids no more than once a year * No hospitalizations  - Return next Wednesday (03/14/23) for skin testing.  Hold antihistamines for 3 days prior to this visit.  - Allergy medication plan will be based on testing results.  Until then can continue use of Claritin as needed.  Nasonex 2 sprays each nostril daily for 1-2 weeks at a time before stopping once nasal congestion improves for maximum benefit.  Pataday 1 drop each eye daily as needed for itchy/watery eyes.

## 2023-03-07 NOTE — Progress Notes (Signed)
New Patient Note  RE: Cathy Bruce MRN: [id number] DOB: Sep 21, 1960 Date of Office Visit: 03/07/2023  Primary care provider: Rodrigo Ran, MD  Chief Complaint:   History of present illness: Cathy Bruce is a 62 y.o. female presenting today for evaluation of   She states her allergies eme to be worse.   She sates when she gets sick she has a really ad cough.  In the past year she states she had had at least 3 PCP visits for symptoms and has had CXR to eval from pneumonia which the CXR were clear.  She states the ast episode was around [place].  She recalls in  past has had steorid shot, prednisone, antibiotics.  Also reports will usually take mucinex dm, cough syrup and inhaler.   She was prescribed an xopenex inhaler when she had bronchitis in the past.    She does report nasal drainage, congestion, sneezing, itchy/watery eyes, itch throat, sinus pressure sometimes.  She uses nasonex.  Will use claritin as needed.   She does use   She used to see Dr Corinda Gubler before he retired and states was on allergy shots at that time.  She states she has been off allergen shots for about 15 years and did them for about 10 years.  She does recall singulair in the past.   No history of eczema or food allergy.   Review of systems: 10pt ROS negative unless noted above in HPI  Past medical history: Past Medical History:  Diagnosis Date   ADD (attention deficit disorder)    Atrial tachycardia    s/p ablation by Dr Graciela Husbands in 2005   Coronary artery disease, non-occlusive 06/2018   High coronary calcium score: Coronary CTA shows heavily calcified proximal LAD lesion with moderate stenosis (FFR 0.95 across lesion, distal LAD FFR 0.82 -- not likely physiologic significant).  Mild disease elsewhere.   DDD (degenerative disc disease), lumbar    Difficult intubation    Diverticulosis    Dyslipidemia    As of October 2019: LDL P 1564.  Goal less than 100.   Dysrhythmia    GERD  (gastroesophageal reflux disease)    Internal hemorrhoids    Pneumonia    Sleep apnea    uses CPAP   Social anxiety disorder    SVT (supraventricular tachycardia)    Status post ablation by Dr. Johney Frame in 2015    Past surgical history: Past Surgical History:  Procedure Laterality Date   ABDOMINAL HYSTERECTOMY     ABLATION OF DYSRHYTHMIC FOCUS  2005   atrial tachycardia ablated by Dr Graciela Husbands   ABLATION OF DYSRHYTHMIC FOCUS  08/26/2013   atrial tachycardia ablated by Dr Johney Frame, focus located along the tricuspid valve annulus   ATRIAL FIBRILLATION ABLATION N/A 08/26/2013   Procedure: ATRIAL FIBRILLATION ABLATION;  Surgeon: Gardiner Rhyme, MD;  Location: [facility];  Service: Cardiovascular;  Laterality: N/A; ACTUALLY ATRIAL TACHYCARDIA   BACK SURGERY  01/2021   Lower back vertebral pad replacement, 2 screws in place   c-section  1997, 1988   x2   COLONOSCOPY  2017   Dr.Pyrtle   COLONOSCOPY WITH PROPOFOL N/A 10/17/2021   Procedure: COLONOSCOPY WITH PROPOFOL;  Surgeon: Lynann Bologna, MD;  Location: [facility];  Service: Endoscopy;  Laterality: N/A;   CYSTOSCOPY N/A 10/11/2021   Procedure: CYSTOSCOPY;  Surgeon: Noel Christmas, MD;  Location: [facility];  Service: Urology;  Laterality: N/A;   HEMORRHOID SURGERY  2002   PARTIAL  HYSTERECTOMY  2000   SHOULDER SURGERY Right    TRANSTHORACIC ECHOCARDIOGRAM  07/2013   EF 55 to 60%.  GRII DD.  Mild AI, mild MR.   TRANSURETHRAL RESECTION OF BLADDER TUMOR N/A 10/11/2021   Procedure: TRANSURETHRAL RESECTION OF BLADDER TUMOR (TURBT) WITH URETHRAL DILATION;  Surgeon: Noel Christmas, MD;  Location: [facility];  Service: Urology;  Laterality: N/A;   TUBAL LIGATION      Family history:  Family History  Problem Relation Age of Onset   CAD Mother 47       CABG   Peripheral vascular disease Mother    Dementia Mother    Hypertension Mother    Heart disease Father    Lung cancer Father    Hypertension Father     Melanoma Sister    Healthy Brother    Healthy Brother    Heart disease Maternal Aunt    Heart attack Maternal Grandmother 50   CAD Maternal Grandmother 54       CABG at 63   Coronary artery disease Other    Colon cancer Cousin 52   Stomach cancer Neg Hx    Rectal cancer Neg Hx     Social history:    Medication List: Current Outpatient Medications  Medication Sig Dispense Refill   Alirocumab (PRALUENT) 150 MG/ML SOAJ Take 150 mg by mouth every 14 (fourteen) days.     aspirin EC 81 MG tablet Take 81 mg by mouth every evening. Swallow whole.     Cholecalciferol (VITAMIN D3 PO) Take 1 tablet by mouth daily.     conjugated estrogens (PREMARIN) vaginal cream Place 1 Applicatorful vaginally 2 (two) times a week.     docusate sodium (COLACE) 100 MG capsule Take 1 capsule (100 mg total) by mouth 2 (two) times daily. 60 capsule 2   ezetimibe (ZETIA) 10 MG tablet Take 10 mg by mouth at bedtime.     HYDROMET 5-1.5 MG/5ML syrup Take 5 mLs by mouth every 6 (six) hours as needed for cough.     ibuprofen (ADVIL) 200 MG tablet Take 400 mg by mouth every 6 (six) hours as needed.     levalbuterol (XOPENEX HFA) 45 MCG/ACT inhaler Inhale 2 puffs into the lungs every 4 (four) hours as needed.     metoprolol succinate (TOPROL-XL) 25 MG 24 hr tablet TAKE 1 TABLET BY MOUTH EVERY DAY 90 tablet 3   Multiple Vitamin (MULTIVITAMIN WITH MINERALS) TABS tablet Take 1 tablet by mouth daily. Women's One-A-Day     niacin 500 MG tablet Take 500 mg by mouth at bedtime.     nitroGLYCERIN (NITROSTAT) 0.4 MG SL tablet Place 0.4 mg under the tongue every 5 (five) minutes as needed for chest pain.     Omega-3 Fatty Acids (FISH OIL PO) Take 2 capsules by mouth at bedtime.     PULMICORT FLEXHALER 90 MCG/ACT inhaler Inhale 1 puff into the lungs 2 (two) times daily.     REPATHA SURECLICK 140 MG/ML SOAJ Inject 140 mg as directed every 14 (fourteen) days.     Semaglutide-Weight Management 0.5 MG/0.5ML SOAJ Inject 0.5 mg into  the skin every 7 (seven) days. 2 mL 0   simvastatin (ZOCOR) 40 MG tablet Take 40 mg by mouth at bedtime.  3   triamcinolone (KENALOG) 0.1 % paste Use as directed 1 application in the mouth or throat 2 (two) times daily as needed (sores).     omeprazole (PRILOSEC) 40 MG capsule Take 1 capsule  by mouth 2 (two) times daily.     valACYclovir (VALTREX) 1000 MG tablet Take 1,000 mg by mouth as needed.     No current facility-administered medications for this visit.    Known medication allergies: No Known Allergies   Physical examination: Blood pressure 122/70, pulse 78, temperature 98 F (36.7 C), temperature source Temporal, resp. rate 16, height 5\' 4"  (1.626 m), weight 168 lb 11.2 oz (76.5 kg), SpO2 96%.  General: Alert, interactive, in no acute distress. HEENT: PERRLA, TMs pearly gray, turbinates minimally edematous without discharge, post-pharynx non erythematous. Neck: Supple without lymphadenopathy. Lungs: Clear to auscultation without wheezing, rhonchi or rales. {no increased work of breathing. CV: Normal S1, S2 without murmurs. Abdomen: Nondistended, nontender. Skin: Warm and dry, without lesions or rashes. Extremities:  No clubbing, cyanosis or edema. Neuro:   Grossly intact.  Diagnositics/Labs:  Spirometry: FEV1: 1.87L 77%, FVC: 2.74L 74% predicted.  S/p albuterol no significant improvement in FEV1 at 1.89L 78%.  Allergy testing: unable to perform today due to insurance coverage   Assessment and plan: Reactive airway, mild intermittent due to illness - Neb machine and teaching provided. - Daily controller medication(s): none at this time - Prior to physical activity if needed: albuterol 2 puffs or Airsupra 10-15 minutes before physical activity. - Rescue medications: Airsupra 2 puffs OR albuterol 2 puffs OR albuterol 1 vial via nebulizer every 4-6 hours as needed Paulene Floor is a rescue inhaler with albuterol + budesonide in pump; this inhaler is superior to albuterol alone  and usually provides longer lasting benefit vs albuterol alone.  Airsupra sample provided and have pharmacy run the copay assistance  - Breathing control goals:  * Full participation in all desired activities (may need albuterol before activity) * Albuterol use two time or less a week on average (not counting use with activity) * Cough interfering with sleep two time or less a month * Oral steroids no more than once a year * No hospitalizations  Rhinoconjunctivitis - Return next Wednesday (03/14/23) for skin testing.  Hold antihistamines for 3 days prior to this visit.  - Allergy medication plan will be based on testing results.  Until then can continue use of Claritin as needed.  Nasonex 2 sprays each nostril daily for 1-2 weeks at a time before stopping once nasal congestion improves for maximum benefit.  Pataday 1 drop each eye daily as needed for itchy/watery eyes.   I appreciate the opportunity to take part in Tanga's care. Please do not hesitate to contact me with questions.  Sincerely,   Margo Aye, MD Allergy/Immunology [facility]

## 2023-03-08 NOTE — Addendum Note (Signed)
Addended by: Philipp Deputy on: 03/08/2023 12:13 PM   Modules accepted: Orders

## 2023-03-12 ENCOUNTER — Other Ambulatory Visit: Payer: Self-pay | Admitting: Cardiology

## 2023-03-14 ENCOUNTER — Other Ambulatory Visit: Payer: Self-pay

## 2023-03-14 ENCOUNTER — Encounter: Payer: Self-pay | Admitting: Allergy

## 2023-03-14 ENCOUNTER — Ambulatory Visit (INDEPENDENT_AMBULATORY_CARE_PROVIDER_SITE_OTHER): Payer: 59 | Admitting: Allergy

## 2023-03-14 ENCOUNTER — Telehealth: Payer: Self-pay | Admitting: Cardiology

## 2023-03-14 VITALS — BP 108/60 | HR 88 | Temp 98.5°F | Ht 64.0 in | Wt 168.1 lb

## 2023-03-14 DIAGNOSIS — H1013 Acute atopic conjunctivitis, bilateral: Secondary | ICD-10-CM | POA: Diagnosis not present

## 2023-03-14 DIAGNOSIS — J302 Other seasonal allergic rhinitis: Secondary | ICD-10-CM

## 2023-03-14 DIAGNOSIS — J4520 Mild intermittent asthma, uncomplicated: Secondary | ICD-10-CM

## 2023-03-14 DIAGNOSIS — J3089 Other allergic rhinitis: Secondary | ICD-10-CM

## 2023-03-14 MED ORDER — NITROGLYCERIN 0.4 MG SL SUBL: 0.4000 mg | SUBLINGUAL_TABLET | SUBLINGUAL | 3 refills | Status: AC | PRN

## 2023-03-14 NOTE — Patient Instructions (Signed)
-   Daily controller medication(s): none at this time - Prior to physical activity if needed: albuterol 2 puffs or Airsupra 10-15 minutes before physical activity. - Rescue medications: Airsupra 2 puffs OR albuterol 2 puffs OR albuterol 1 vial via nebulizer every 4-6 hours as needed Paulene Floor is a rescue inhaler with albuterol + budesonide in pump; this inhaler is superior to albuterol alone and usually provides longer lasting benefit vs albuterol alone.  - Breathing control goals:  * Full participation in all desired activities (may need albuterol before activity) * Albuterol use two time or less a week on average (not counting use with activity) * Cough interfering with sleep two time or less a month * Oral steroids no more than once a year * No hospitalizations  - Environmental allergy testing today showed: grasses, weeds, trees, indoor molds, outdoor molds, and dust mites - Copy of test results provided.  - Avoidance measures provided. - Start taking: Xyzal (levocetirizine) 5mg  tablet once daily.  Replaces Claritin if more effective.  Ryaltris (olopatadine/mometasone) two sprays per nostril 1-2 times daily as needed for runny or stuffy nose.  Pataday 1 drop each eye daily as needed for itchy/watery eyes.  - You can use an extra dose of the antihistamine, if needed, for breakthrough symptoms.  - Consider nasal saline rinses 1-2 times daily to remove allergens from the nasal cavities as well as help with mucous clearance (this is especially helpful to do before the nasal sprays are given) - Consider another course of allergy shots as a means of long-term control if medication management is not effective.  - Allergy shots "re-train" and "reset" the immune system to ignore environmental allergens and decrease the resulting immune response to those allergens (sneezing, itchy watery eyes, runny nose, nasal congestion, etc).    - Allergy shots improve symptoms in 75-85% of patients.   Follow-up in  4-6 months or sooner if needed

## 2023-03-14 NOTE — Progress Notes (Signed)
Follow-up Note  RE: Cathy Bruce MRN: [id number] DOB: [birth year] Date of Office Visit: 03/14/2023   History of present illness: Cathy Bruce is a 62 y.o. female presenting today for skin testing visit.  She was last seen in the office on 03/07/23 for initial visit.  She has held antihistamines for at least past 3 days for testing today.  She states without taking antihistamines allergy symptoms are flaring.    Review of systems: 10pt ROS negative unless noted above in HPI  Past medical/social/surgical/family history have been reviewed and are unchanged unless specifically indicated below.  No changes  Medication List: Current Outpatient Medications  Medication Sig Dispense Refill   Alirocumab (PRALUENT) 150 MG/ML SOAJ Take 150 mg by mouth every 14 (fourteen) days.     aspirin EC 81 MG tablet Take 81 mg by mouth every evening. Swallow whole.     Cholecalciferol (VITAMIN D3 PO) Take 1 tablet by mouth daily.     conjugated estrogens (PREMARIN) vaginal cream Place 1 Applicatorful vaginally 2 (two) times a week.     docusate sodium (COLACE) 100 MG capsule Take 1 capsule (100 mg total) by mouth 2 (two) times daily. 60 capsule 2   ezetimibe (ZETIA) 10 MG tablet Take 10 mg by mouth at bedtime.     HYDROMET 5-1.5 MG/5ML syrup Take 5 mLs by mouth every 6 (six) hours as needed for cough.     ibuprofen (ADVIL) 200 MG tablet Take 400 mg by mouth every 6 (six) hours as needed.     levalbuterol (XOPENEX HFA) 45 MCG/ACT inhaler Inhale 2 puffs into the lungs every 4 (four) hours as needed.     metoprolol succinate (TOPROL-XL) 25 MG 24 hr tablet TAKE 1 TABLET BY MOUTH EVERY DAY 90 tablet 2   Multiple Vitamin (MULTIVITAMIN WITH MINERALS) TABS tablet Take 1 tablet by mouth daily. Women's One-A-Day     niacin 500 MG tablet Take 500 mg by mouth at bedtime.     Omega-3 Fatty Acids (FISH OIL PO) Take 2 capsules by mouth at bedtime.     omeprazole (PRILOSEC) 40 MG capsule Take 1 capsule by mouth  2 (two) times daily.     PULMICORT FLEXHALER 90 MCG/ACT inhaler Inhale 1 puff into the lungs 2 (two) times daily.     REPATHA SURECLICK 140 MG/ML SOAJ Inject 140 mg as directed every 14 (fourteen) days.     Semaglutide-Weight Management 0.5 MG/0.5ML SOAJ Inject 0.5 mg into the skin every 7 (seven) days. 2 mL 0   simvastatin (ZOCOR) 40 MG tablet Take 40 mg by mouth at bedtime.  3   triamcinolone (KENALOG) 0.1 % paste Use as directed 1 application in the mouth or throat 2 (two) times daily as needed (sores).     valACYclovir (VALTREX) 1000 MG tablet Take 1,000 mg by mouth as needed.     nitroGLYCERIN (NITROSTAT) 0.4 MG SL tablet Place 1 tablet (0.4 mg total) under the tongue every 5 (five) minutes as needed for chest pain. 90 tablet 3   No current facility-administered medications for this visit.     Known medication allergies: No Known Allergies   Physical examination: Blood pressure 108/60, pulse 88, temperature 98.5 F (36.9 C), height 5\' 4"  (1.626 m), weight 168 lb 1.8 oz (76.3 kg), SpO2 96%.  General: Alert, interactive, in no acute distress. HEENT: PERRLA, TMs pearly gray, turbinates mildly edematous without discharge, post-pharynx non erythematous. Neck: Supple without lymphadenopathy. Lungs: Clear to auscultation without wheezing, rhonchi  or rales. {no increased work of breathing. CV: Normal S1, S2 without murmurs. Abdomen: Nondistended, nontender. Skin: Warm and dry, without lesions or rashes. Extremities:  No clubbing, cyanosis or edema. Neuro:   Grossly intact.  Diagnositics/Labs:  Allergy testing:   Airborne Adult Perc - 03/14/23 1500     Time Antigen Placed 1459    Allergen Manufacturer Waynette Buttery    Location Back    Number of Test 55    1. Control-Buffer 50% Glycerol Negative    2. Control-Histamine 2+    3. Bahia 2+    4. French Southern Territ[place] Negative    5. Johnson Negative    6. [place] Blue 2+    7. Meadow Fescue Negative    8. Perennial Rye Negative    9. Timothy 2+     10. Ragweed Mix Negative    11. Cocklebur Negative    12. Plantain,  English Negative    13. Baccharis 2+    14. Dog Fennel 2+    15. Guernsey Thistle 2+    16. Lamb's Quarters Negative    17. Sheep Sorrell 2+    18. Rough Pigweed Negative    19. Marsh Elder, Rough 2+    20. Mugwort, Common Negative    21. Box, Elder 2+    22. Cedar, red Negative    23. Sweet Gum Negative    24. Pecan Pollen Negative    25. Pine Mix Negative    26. Walnut, Black Pollen Negative    27. Red Mulberry 2+    28. Ash Mix 2+    29. Birch Mix 4+    30. Beech American 3+    31. Cottonwood, [place] Negative    32. Hickory, White Negative    33. Maple Mix Negative    34. Oak, [place] Mix 3+    35. Sycamore Eastern 2+    36. Alternaria Alternata 2+    37. Cladosporium Herbarum Negative    38. Aspergillus Mix Negative    39. Penicillium Mix Negative    40. Bipolaris Sorokiniana (Helminthosporium) Negative    41. Drechslera Spicifera (Curvularia) Negative    42. Mucor Plumbeus Negative    43. Fusarium Moniliforme Negative    44. Aureobasidium Pullulans (pullulara) Negative    45. Rhizopus Oryzae Negative    46. Botrytis Cinera Negative    47. Epicoccum Nigrum Negative    48. Phoma Betae Negative    49. Dust Mite Mix Negative    50. Cat Hair 10,000 BAU/ml Negative    51.  Dog Epithelia Negative    52. Mixed Feathers Negative    53. Horse Epithelia Negative    54. Cockroach, German Negative    55. Tobacco Leaf Negative             Intradermal - 03/14/23 1500     Time Antigen Placed 1523    Allergen Manufacturer Waynette Buttery    Location Back;Arm    Number of Test 8    Control Negative    Mold 2 2+    Mold 3 2+    Mold 4 2+    Mite Mix 2+    Cat Negative    Dog Negative    Cockroach Negative             Allergy testing results were read and interpreted by provider, documented by clinical staff.   Assessment and plan: Patient Instructions  - Daily controller medication(s): none at  this time - Prior to physical activity if  needed: albuterol 2 puffs or Airsupra 10-15 minutes before physical activity. - Rescue medications: Airsupra 2 puffs OR albuterol 2 puffs OR albuterol 1 vial via nebulizer every 4-6 hours as needed Cathy Bruce is a rescue inhaler with albuterol + budesonide in pump; this inhaler is superior to albuterol alone and usually provides longer lasting benefit vs albuterol alone.  - Breathing control goals:  * Full participation in all desired activities (may need albuterol before activity) * Albuterol use two time or less a week on average (not counting use with activity) * Cough interfering with sleep two time or less a month * Oral steroids no more than once a year * No hospitalizations  - Environmental allergy testing today showed: grasses, weeds, trees, indoor molds, outdoor molds, and dust mites - Copy of test results provided.  - Avoidance measures provided. - Start taking: Xyzal (levocetirizine) 5mg  tablet once daily.  Replaces Claritin if more effective.  Ryaltris (olopatadine/mometasone) two sprays per nostril 1-2 times daily as needed for runny or stuffy nose.  Pataday 1 drop each eye daily as needed for itchy/watery eyes.  - You can use an extra dose of the antihistamine, if needed, for breakthrough symptoms.  - Consider nasal saline rinses 1-2 times daily to remove allergens from the nasal cavities as well as help with mucous clearance (this is especially helpful to do before the nasal sprays are given) - Consider another course of allergy shots as a means of long-term control if medication management is not effective.  - Allergy shots "re-train" and "reset" the immune system to ignore environmental allergens and decrease the resulting immune response to those allergens (sneezing, itchy watery eyes, runny nose, nasal congestion, etc).    - Allergy shots improve symptoms in 75-85% of patients.   Follow-up in 4-6 months or sooner if needed  Return in  about 4 months (around 07/14/2023).  I appreciate the opportunity to take part in Cathy Bruce's care. Please do not hesitate to contact me with questions.  Sincerely,   Margo Aye, MD Allergy/Immunology [facility]

## 2023-03-14 NOTE — Telephone Encounter (Signed)
  Pt c/o medication issue:  1. Name of Medication: nitroGLYCERIN (NITROSTAT) 0.4 MG SL tablet   2. How are you currently taking this medication (dosage and times per day)?   Place 0.4 mg under the tongue every 5 (five) minutes as needed for chest pain.    3. Are you having a reaction (difficulty breathing--STAT)? No  4. What is your medication issue? Patient is calling because she has been trying to get this medication refilled. Patient was told she would need to see Dr. Herbie Baltimore first. Patient saw Dr. Herbie Baltimore, but is still unable to get this medication. Patient is requesting a call back. Please advise.

## 2023-03-14 NOTE — Telephone Encounter (Signed)
Returned call to pt in regards to her nitroglycerin. Pt saw Dr. Herbie Baltimore in January and needs a refill on her nitroglycerin. Will reorder this medication for her. Pt verbalized understanding.

## 2023-03-15 ENCOUNTER — Telehealth: Payer: Self-pay | Admitting: Allergy

## 2023-03-15 ENCOUNTER — Other Ambulatory Visit: Payer: Self-pay | Admitting: Allergy

## 2023-03-15 MED ORDER — RYALTRIS 665-25 MCG/ACT NA SUSP: 2.0000 | Freq: Two times a day (BID) | NASAL | 5 refills | Status: DC

## 2023-03-15 NOTE — Telephone Encounter (Signed)
Patient request ryaltris nosespray, she looked for it otc but states she was told it' needs a prescription. Please send to [facility] [address]

## 2023-03-15 NOTE — Telephone Encounter (Signed)
[facility] even some local pharmacies are able to get it

## 2023-03-16 MED ORDER — RYALTRIS 665-25 MCG/ACT NA SUSP: 2.0000 | Freq: Two times a day (BID) | NASAL | 5 refills | Status: DC

## 2023-03-19 ENCOUNTER — Other Ambulatory Visit (HOSPITAL_COMMUNITY): Payer: Self-pay

## 2023-03-20 ENCOUNTER — Other Ambulatory Visit (HOSPITAL_COMMUNITY): Payer: Self-pay

## 2023-03-20 MED ORDER — WEGOVY 0.5 MG/0.5ML ~~LOC~~ SOAJ
0.5000 mg | SUBCUTANEOUS | 0 refills | Status: AC
  Filled 2023-03-20 – 2023-12-03 (×5): qty 2, 28d supply, fill #0

## 2023-03-21 ENCOUNTER — Other Ambulatory Visit: Payer: Self-pay

## 2023-03-21 ENCOUNTER — Other Ambulatory Visit (HOSPITAL_BASED_OUTPATIENT_CLINIC_OR_DEPARTMENT_OTHER): Payer: Self-pay

## 2023-03-21 ENCOUNTER — Other Ambulatory Visit (HOSPITAL_COMMUNITY): Payer: Self-pay

## 2023-03-21 MED ORDER — WEGOVY 1 MG/0.5ML ~~LOC~~ SOAJ
1.0000 mg | SUBCUTANEOUS | 0 refills | Status: DC
  Filled 2023-03-21: qty 2, 28d supply, fill #0

## 2023-03-21 MED ORDER — WEGOVY 2.4 MG/0.75ML ~~LOC~~ SOAJ
2.4000 mg | SUBCUTANEOUS | 6 refills | Status: AC
  Filled 2023-07-26: qty 3, 28d supply, fill #0
  Filled 2023-08-06 – 2023-09-21 (×2): qty 3, 28d supply, fill #1

## 2023-03-21 MED ORDER — WEGOVY 1.7 MG/0.75ML ~~LOC~~ SOAJ
1.7000 mg | SUBCUTANEOUS | 0 refills | Status: AC
  Filled 2023-10-29: qty 3, 28d supply, fill #0

## 2023-03-22 ENCOUNTER — Other Ambulatory Visit (HOSPITAL_COMMUNITY): Payer: Self-pay

## 2023-04-11 ENCOUNTER — Other Ambulatory Visit (HOSPITAL_COMMUNITY): Payer: Self-pay

## 2023-04-11 MED ORDER — WEGOVY 2.4 MG/0.75ML ~~LOC~~ SOAJ
2.4000 mg | SUBCUTANEOUS | 6 refills | Status: AC
  Filled 2023-04-11 – 2023-05-07 (×2): qty 3, 28d supply, fill #0
  Filled 2023-05-31: qty 3, 28d supply, fill #1
  Filled 2023-07-02: qty 3, 28d supply, fill #2
  Filled 2023-10-29 – 2023-11-21 (×2): qty 3, 28d supply, fill #3

## 2023-04-11 MED ORDER — WEGOVY 1.7 MG/0.75ML ~~LOC~~ SOAJ
1.7000 mg | SUBCUTANEOUS | 0 refills | Status: AC
  Filled 2023-04-11: qty 3, 28d supply, fill #0

## 2023-04-12 ENCOUNTER — Other Ambulatory Visit: Payer: Self-pay

## 2023-05-07 ENCOUNTER — Other Ambulatory Visit (HOSPITAL_COMMUNITY): Payer: Self-pay

## 2023-05-07 ENCOUNTER — Other Ambulatory Visit: Payer: Self-pay

## 2023-05-31 ENCOUNTER — Other Ambulatory Visit: Payer: Self-pay

## 2023-05-31 ENCOUNTER — Other Ambulatory Visit (HOSPITAL_COMMUNITY): Payer: Self-pay

## 2023-06-05 ENCOUNTER — Other Ambulatory Visit (HOSPITAL_COMMUNITY): Payer: Self-pay

## 2023-07-02 ENCOUNTER — Other Ambulatory Visit (HOSPITAL_COMMUNITY): Payer: Self-pay

## 2023-07-10 IMAGING — CR DG LUMBAR SPINE 1V
1 series · 1 of 1 positions shown · non-contrast
Comparison: MRI 04/24/2018.

CLINICAL DATA: Lumbar spine surgery.

EXAM:
LUMBAR SPINE - 1 VIEW

[xtable lateral]
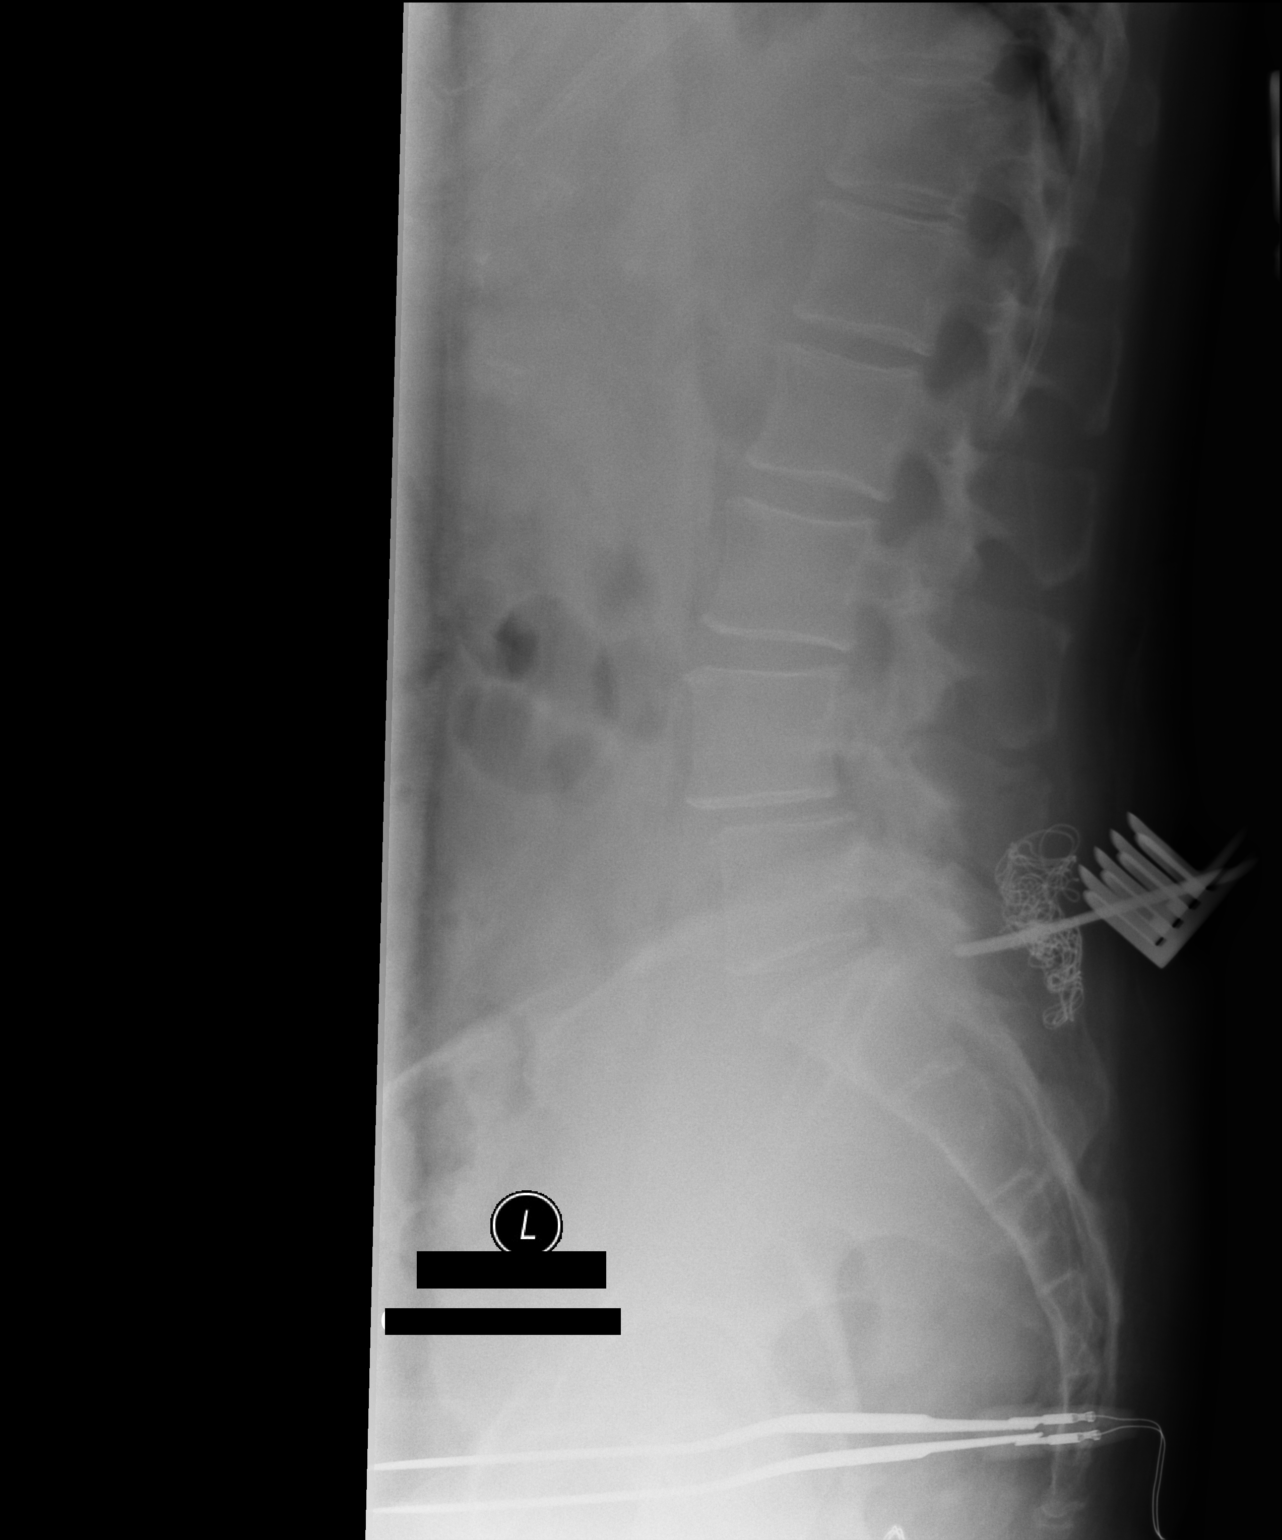

[1 of 1 positions shown; findings below may reference images not displayed]

FINDINGS: Lumbar spine numbered as per prior MRI. Metallic marker noted
posteriorly at the level of L5-S1. A sponge is noted posteriorly.
Mild anterolisthesis L4 on L5 again noted.
IMPRESSION: Metallic marker noted posteriorly at the level of L5-S1.

## 2023-07-26 ENCOUNTER — Other Ambulatory Visit (HOSPITAL_COMMUNITY): Payer: Self-pay

## 2023-07-27 ENCOUNTER — Other Ambulatory Visit (HOSPITAL_COMMUNITY): Payer: Self-pay

## 2023-08-02 DIAGNOSIS — R7301 Impaired fasting glucose: Secondary | ICD-10-CM | POA: Diagnosis not present

## 2023-08-02 DIAGNOSIS — I2510 Atherosclerotic heart disease of native coronary artery without angina pectoris: Secondary | ICD-10-CM | POA: Diagnosis not present

## 2023-08-02 DIAGNOSIS — E663 Overweight: Secondary | ICD-10-CM | POA: Diagnosis not present

## 2023-08-02 DIAGNOSIS — G4733 Obstructive sleep apnea (adult) (pediatric): Secondary | ICD-10-CM | POA: Diagnosis not present

## 2023-08-06 ENCOUNTER — Other Ambulatory Visit (HOSPITAL_COMMUNITY): Payer: Self-pay

## 2023-08-13 ENCOUNTER — Other Ambulatory Visit (HOSPITAL_COMMUNITY): Payer: Self-pay

## 2023-08-15 ENCOUNTER — Other Ambulatory Visit (HOSPITAL_COMMUNITY): Payer: Self-pay

## 2023-08-21 ENCOUNTER — Other Ambulatory Visit (HOSPITAL_COMMUNITY): Payer: Self-pay

## 2023-08-22 ENCOUNTER — Other Ambulatory Visit (HOSPITAL_COMMUNITY): Payer: Self-pay

## 2023-09-05 ENCOUNTER — Telehealth: Payer: Self-pay

## 2023-09-05 MED ORDER — ALBUTEROL SULFATE (2.5 MG/3ML) 0.083% IN NEBU: 2.5000 mg | INHALATION_SOLUTION | Freq: Four times a day (QID) | RESPIRATORY_TRACT | 1 refills | Status: DC | PRN

## 2023-09-05 NOTE — Telephone Encounter (Signed)
 Albuterol  Nebulizer medication has been sent in. I called patient and informed.

## 2023-09-05 NOTE — Telephone Encounter (Signed)
 Patient called stating she is experiencing a bad cough/wheezing since having the flu. Patient states she was given a nebulizer machine in August, but we never sent any medication in for it. Patient is in [place]  helping her daughter at this time.   Please send to the following pharmacy:  Store [phone] [facility] at   [address] [place], [place] [address] [address]

## 2023-09-06 DIAGNOSIS — B349 Viral infection, unspecified: Secondary | ICD-10-CM | POA: Diagnosis not present

## 2023-09-06 DIAGNOSIS — J189 Pneumonia, unspecified organism: Secondary | ICD-10-CM | POA: Diagnosis not present

## 2023-09-18 ENCOUNTER — Other Ambulatory Visit (HOSPITAL_COMMUNITY): Payer: Self-pay

## 2023-09-21 ENCOUNTER — Other Ambulatory Visit (HOSPITAL_COMMUNITY): Payer: Self-pay

## 2023-09-21 ENCOUNTER — Other Ambulatory Visit: Payer: Self-pay

## 2023-09-21 ENCOUNTER — Encounter: Payer: Self-pay | Admitting: Pharmacist

## 2023-10-29 ENCOUNTER — Other Ambulatory Visit (HOSPITAL_COMMUNITY): Payer: Self-pay

## 2023-10-29 IMAGING — US US THYROID
1 series · 13 of 25 positions shown · non-contrast
Comparison: 05/10/2017; 03/31/2016

CLINICAL DATA: Prior ultrasound follow-up. Follow-up thyroid
nodules.

EXAM:
THYROID ULTRASOUND
TECHNIQUE: Ultrasound examination of the thyroid gland and adjacent soft
tissues was performed.

[Series 1: us thyroid · 0.05mm/px · 13 of 51 slices shown]
[im 1/51]
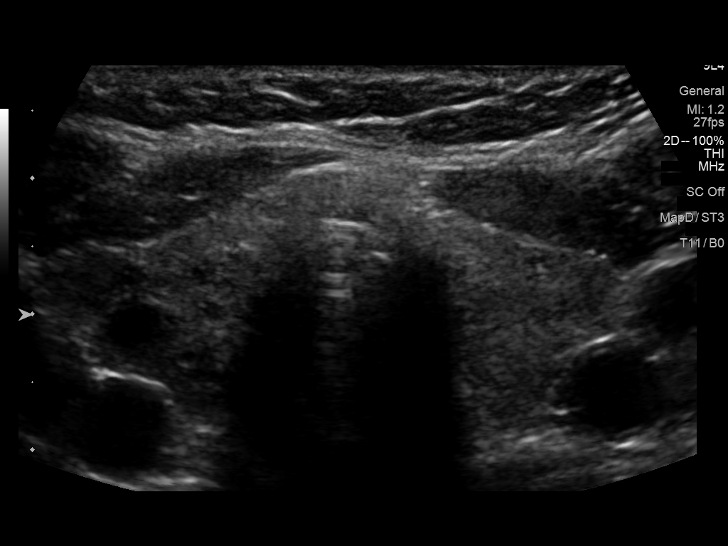
[im 5/51]
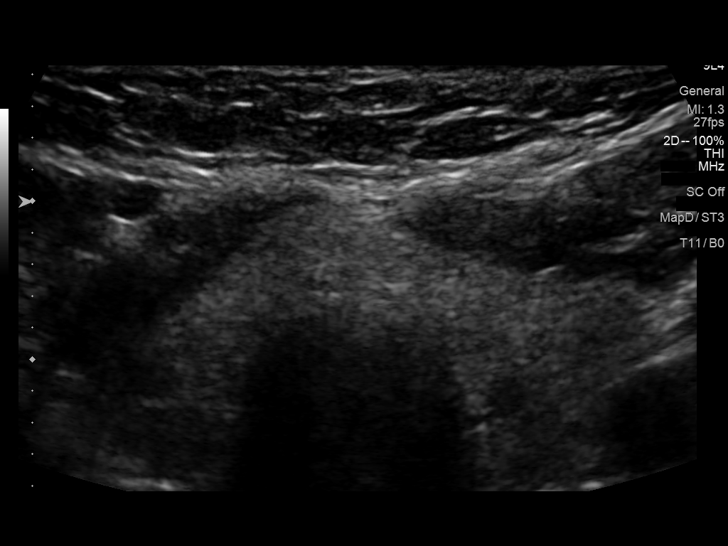
[im 9/51]
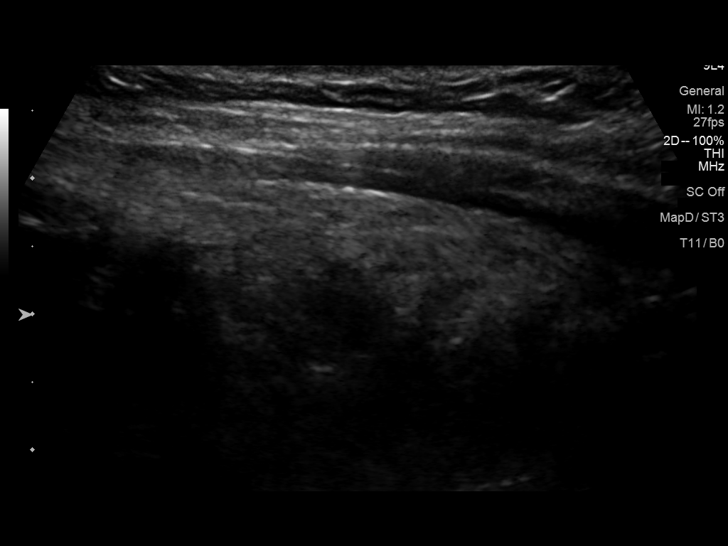
[im 13/51]
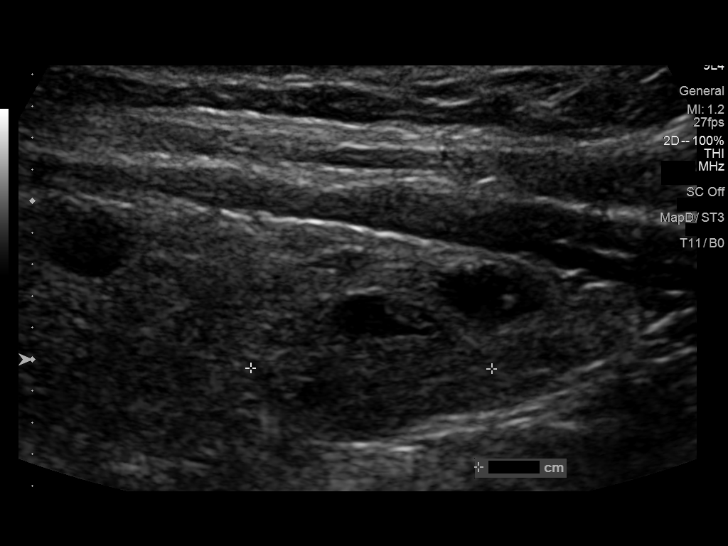
[im 17/51]
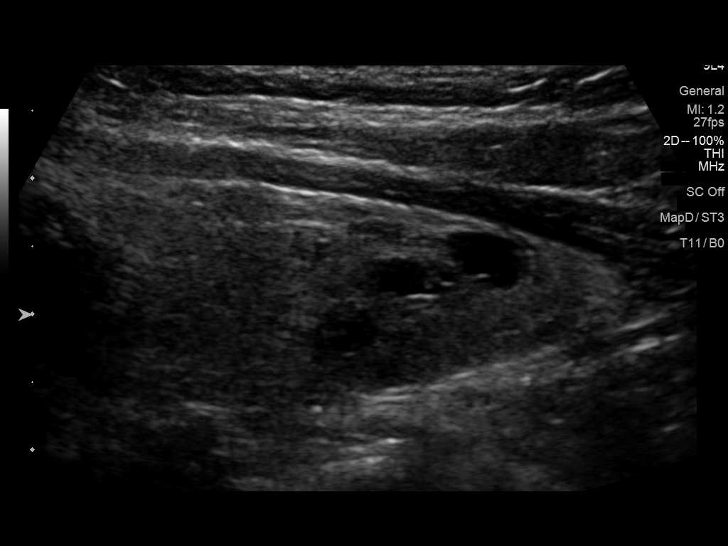
[im 21/51]
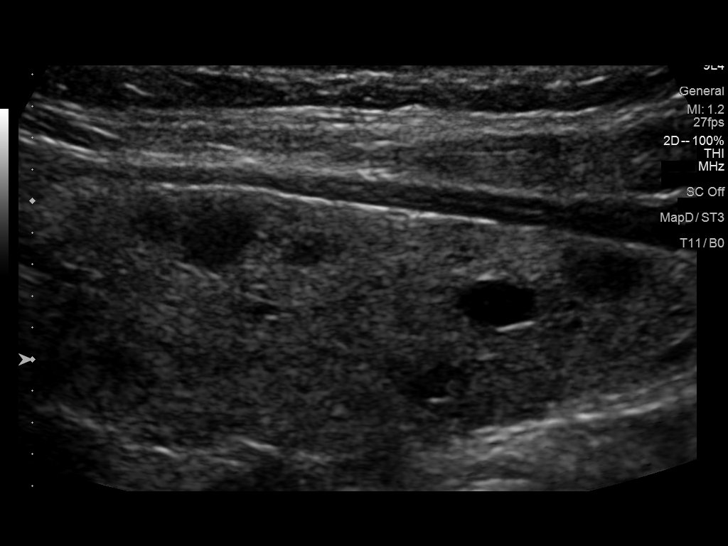
[im 26/51]
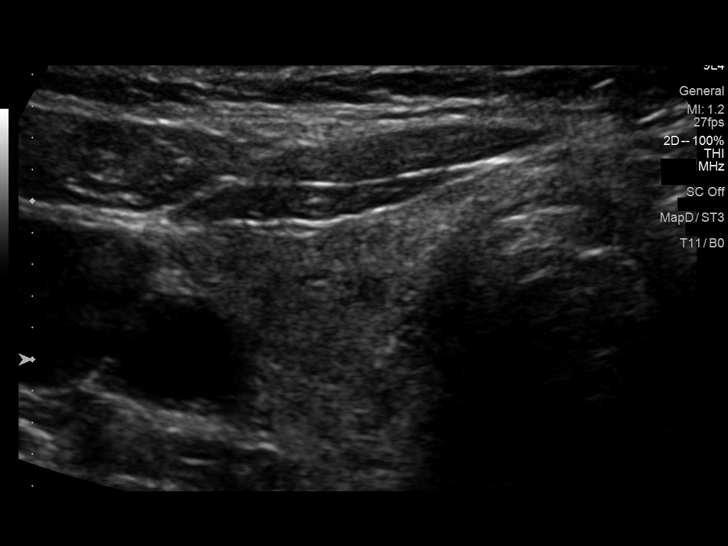
[im 30/51]
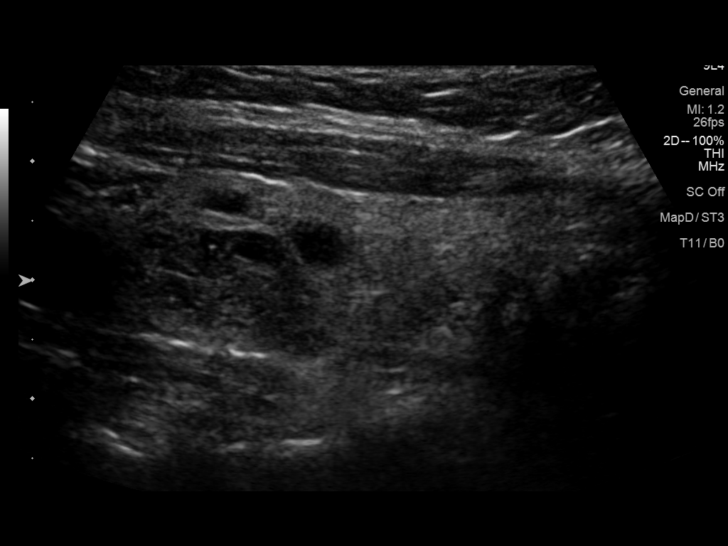
[im 34/51]
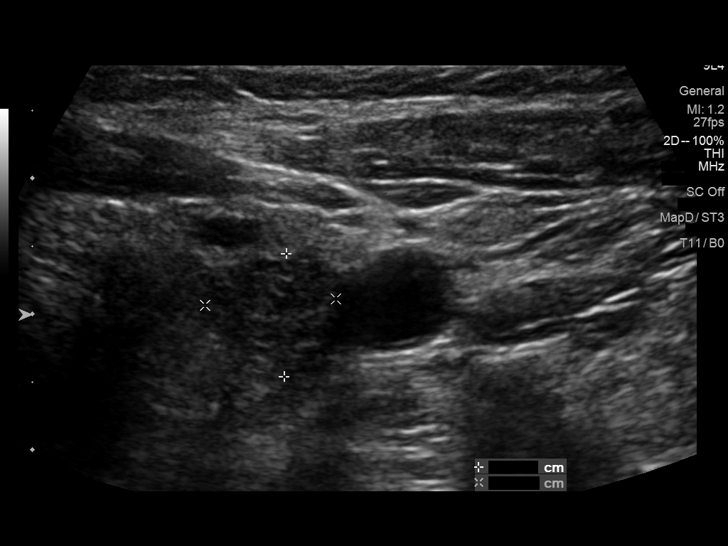
[im 38/51]
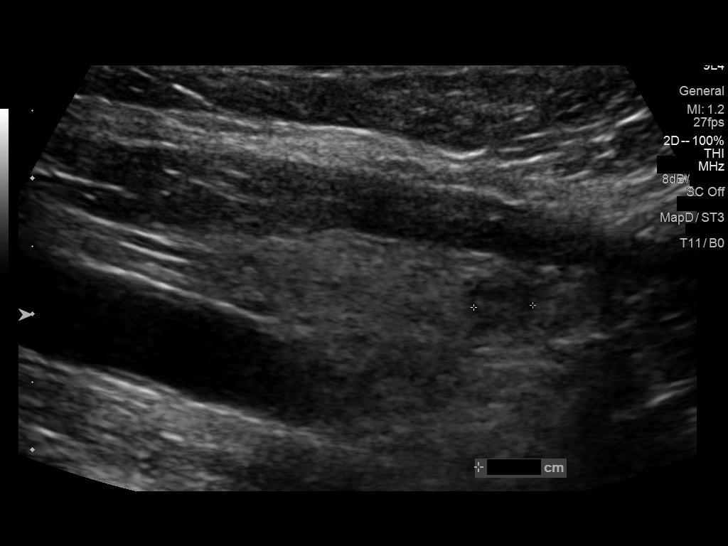
[im 42/51]
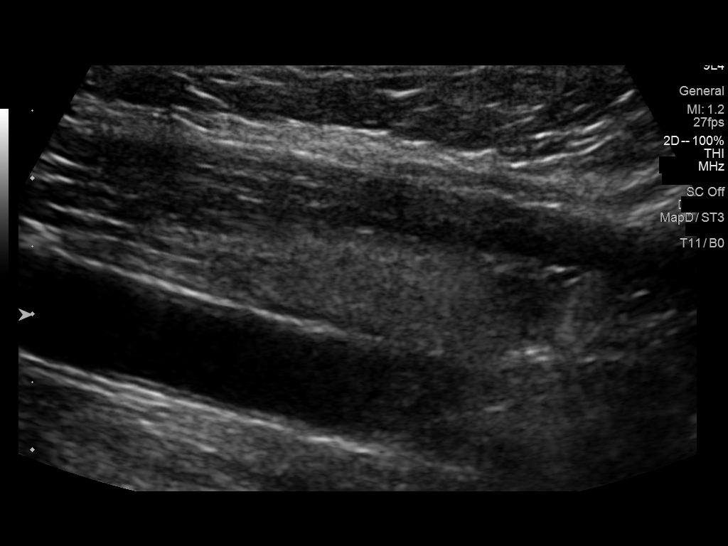
[im 46/51]
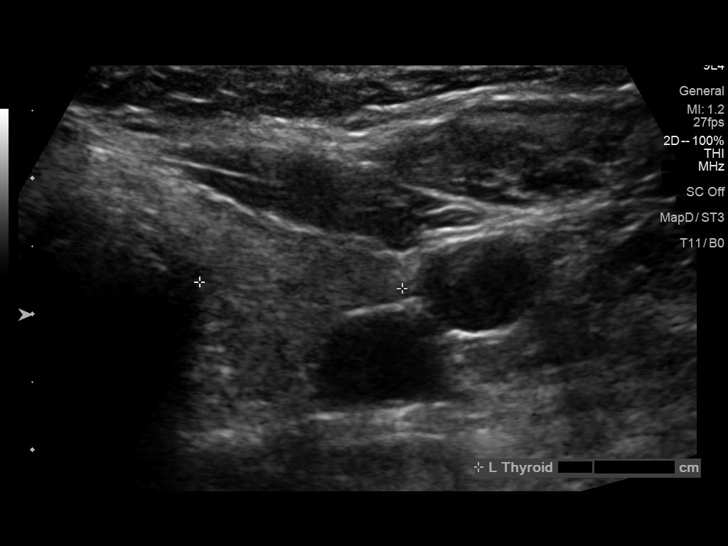
[im 51/51]
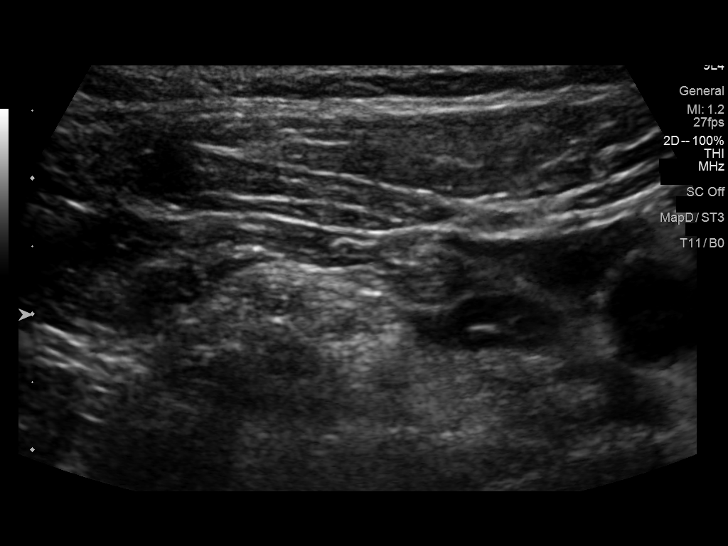

[13 of 25 positions shown; findings below may reference images not displayed]

FINDINGS: Parenchymal Echotexture: Mildly heterogenous

Isthmus: Normal in size measuring 0.3 cm in diameter, unchanged

Right lobe: Normal in size measuring 4.9 x 1.8 x 1.6 cm, previously,
4.3 x 1.7 x 1.6 cm

Left lobe: Normal in size measuring 4.4 x 1.4 x 1.5 cm, previously,
4.2 x 1.4 x 1.3 cm

_________________________________________________________

Estimated total number of nodules >/= 1 cm: 2

Number of spongiform nodules >/=  2 cm not described below (TR1): 0

Number of mixed cystic and solid nodules >/= 1.5 cm not described
below (TR2): 0

_________________________________________________________

There is an approximately 0.8 x 0.5 x 0.5 cm anechoic cyst within
the superior pole the right lobe thyroid (labeled 1), not seen on
previous examinations though does not meet criteria to recommend
percutaneous sampling or continued dedicated follow-up.

The approximately 1.5 x 1.1 x 0.9 cm partially cystic though
predominantly solid nodule within the posteroinferior aspect the
right lobe thyroid (labeled 2), is unchanged compared to the [DATE]
examination, previously, 1.5 x 0.8 x 0.8 cm. Imaging stability for
greater than 5 years is indicative benign etiology.

There is an approximately 0.7 x 0.6 x 0.5 cm minimally complex cyst
within the inferior pole of the right lobe of the thyroid (labeled
3), which has minimally increased in size compared to the 0589
examination, previously, 0.4 cm, however again does not meet
criteria to recommend percutaneous sampling or continued dedicated
follow-up.

_________________________________________________________

The approximately 1.5 x 1.0 x 0.9 cm spongiform/benign-appearing
nodule within the superior pole the left thyroid (labeled 4) is
unchanged compared to [DATE] examination previously, 1.5 x 0.9 x 0
cm, and again does not criteria to recommend percutaneous sampling
or continued follow-up.
IMPRESSION: 1. Similar findings of multinodular goiter. No definitive worrisome
new or enlarging thyroid nodules.
2. None of the discretely measured thyroid nodules/cysts, the
majority of which are unchanged since at least the [DATE]
examination, meet imaging criteria to recommend percutaneous
sampling or continued dedicated follow-up.

The above is in keeping with the ACR TI-RADS recommendations - [HOSPITAL] 0589;[DATE].

## 2023-10-30 ENCOUNTER — Other Ambulatory Visit (HOSPITAL_COMMUNITY): Payer: Self-pay

## 2023-10-30 DIAGNOSIS — R7301 Impaired fasting glucose: Secondary | ICD-10-CM | POA: Diagnosis not present

## 2023-10-30 DIAGNOSIS — G4733 Obstructive sleep apnea (adult) (pediatric): Secondary | ICD-10-CM | POA: Diagnosis not present

## 2023-10-30 DIAGNOSIS — I2510 Atherosclerotic heart disease of native coronary artery without angina pectoris: Secondary | ICD-10-CM | POA: Diagnosis not present

## 2023-10-30 DIAGNOSIS — E663 Overweight: Secondary | ICD-10-CM | POA: Diagnosis not present

## 2023-10-30 MED ORDER — WEGOVY 0.25 MG/0.5ML ~~LOC~~ SOAJ
0.2500 mg | SUBCUTANEOUS | 0 refills | Status: AC
Start: 2023-10-30 — End: ?
  Filled 2023-10-30 – 2023-11-13 (×4): qty 2, 28d supply, fill #0

## 2023-10-31 ENCOUNTER — Other Ambulatory Visit (HOSPITAL_COMMUNITY): Payer: Self-pay

## 2023-10-31 MED ORDER — WEGOVY 1 MG/0.5ML ~~LOC~~ SOAJ: 1.0000 mg | SUBCUTANEOUS | 0 refills | Status: AC

## 2023-10-31 MED ORDER — WEGOVY 0.5 MG/0.5ML ~~LOC~~ SOAJ: 0.5000 mg | SUBCUTANEOUS | 0 refills | Status: AC

## 2023-10-31 MED ORDER — WEGOVY 1.7 MG/0.75ML ~~LOC~~ SOAJ: 1.7000 mg | SUBCUTANEOUS | 0 refills | Status: AC

## 2023-10-31 MED ORDER — WEGOVY 2.4 MG/0.75ML ~~LOC~~ SOAJ: 2.4000 mg | SUBCUTANEOUS | 6 refills | Status: AC

## 2023-11-02 ENCOUNTER — Ambulatory Visit: Payer: 59 | Admitting: Allergy

## 2023-11-09 ENCOUNTER — Other Ambulatory Visit (HOSPITAL_COMMUNITY): Payer: Self-pay

## 2023-11-13 ENCOUNTER — Other Ambulatory Visit (HOSPITAL_COMMUNITY): Payer: Self-pay

## 2023-11-21 ENCOUNTER — Other Ambulatory Visit (HOSPITAL_COMMUNITY): Payer: Self-pay

## 2023-12-03 ENCOUNTER — Other Ambulatory Visit: Payer: Self-pay

## 2023-12-03 ENCOUNTER — Encounter: Payer: Self-pay | Admitting: Pharmacist

## 2023-12-03 ENCOUNTER — Other Ambulatory Visit (HOSPITAL_COMMUNITY): Payer: Self-pay

## 2023-12-03 DIAGNOSIS — J069 Acute upper respiratory infection, unspecified: Secondary | ICD-10-CM | POA: Diagnosis not present

## 2023-12-03 DIAGNOSIS — H6981 Other specified disorders of Eustachian tube, right ear: Secondary | ICD-10-CM | POA: Diagnosis not present

## 2023-12-04 ENCOUNTER — Other Ambulatory Visit: Payer: Self-pay

## 2024-01-15 DIAGNOSIS — G4733 Obstructive sleep apnea (adult) (pediatric): Secondary | ICD-10-CM | POA: Diagnosis not present

## 2024-01-15 DIAGNOSIS — I2510 Atherosclerotic heart disease of native coronary artery without angina pectoris: Secondary | ICD-10-CM | POA: Diagnosis not present

## 2024-01-15 DIAGNOSIS — R319 Hematuria, unspecified: Secondary | ICD-10-CM | POA: Diagnosis not present

## 2024-01-15 DIAGNOSIS — Z1231 Encounter for screening mammogram for malignant neoplasm of breast: Secondary | ICD-10-CM | POA: Diagnosis not present

## 2024-01-15 DIAGNOSIS — Z13820 Encounter for screening for osteoporosis: Secondary | ICD-10-CM | POA: Diagnosis not present

## 2024-01-15 DIAGNOSIS — Z6825 Body mass index (BMI) 25.0-25.9, adult: Secondary | ICD-10-CM | POA: Diagnosis not present

## 2024-01-15 DIAGNOSIS — R7301 Impaired fasting glucose: Secondary | ICD-10-CM | POA: Diagnosis not present

## 2024-01-15 DIAGNOSIS — Z01419 Encounter for gynecological examination (general) (routine) without abnormal findings: Secondary | ICD-10-CM | POA: Diagnosis not present

## 2024-01-15 DIAGNOSIS — E663 Overweight: Secondary | ICD-10-CM | POA: Diagnosis not present

## 2024-01-15 NOTE — Patient Instructions (Addendum)
 Mild intermittent reactive airway disease-Controlled - Daily controller medication(s): none at this time - Prior to physical activity if needed: albuterol  2 puffs or Airsupra 10-15 minutes before physical activity. - Rescue medications: Airsupra 2 puffs OR albuterol  2 puffs OR albuterol  1 vial via nebulizer every 4-6 hours as needed Cathy Bruce is a rescue inhaler with albuterol  + budesonide in pump; this inhaler is superior to albuterol  alone and usually provides longer lasting benefit vs albuterol  alone.  - Breathing control goals:  * Full participation in all desired activities (may need albuterol  before activity) * Albuterol  use two time or less a week on average (not counting use with activity) * Cough interfering with sleep two time or less a month * Oral steroids no more than once a year * No hospitalizations  Seasonal and perennial allergic rhinitis-well controlled - Environmental allergy  testing on 03/14/23 showed: grasses, weeds, trees, indoor molds, outdoor molds, and dust mites - Continue avoidance measures . - May use Xyzal (levocetirizine) 5mg  tablet once daily.   -Continue Ryaltris  (olopatadine/mometasone) two sprays per nostril 1-2 times daily as needed for runny or stuffy nose.  -May use Pataday 1 drop each eye daily as needed for itchy/watery eyes.  - You can use an extra dose of the antihistamine, if needed, for breakthrough symptoms.  - Consider nasal saline rinses 1-2 times daily to remove allergens from the nasal cavities as well as help with mucous clearance (this is especially helpful to do before the nasal sprays are given) - Consider another course of allergy  shots as a means of long-term control if medication management is not effective.  - Allergy  shots re-train and reset the immune system to ignore environmental allergens and decrease the resulting immune response to those allergens (sneezing, itchy watery eyes, runny nose, nasal congestion, etc).    - Allergy  shots  improve symptoms in 75-85% of patients.   Follow-up as needed ( recently moved to [place], [place] area)

## 2024-01-16 ENCOUNTER — Encounter: Payer: Self-pay | Admitting: Family

## 2024-01-16 ENCOUNTER — Ambulatory Visit (INDEPENDENT_AMBULATORY_CARE_PROVIDER_SITE_OTHER): Admitting: Family

## 2024-01-16 ENCOUNTER — Other Ambulatory Visit: Payer: Self-pay | Admitting: Family

## 2024-01-16 ENCOUNTER — Ambulatory Visit: Admitting: Allergy

## 2024-01-16 ENCOUNTER — Other Ambulatory Visit: Payer: Self-pay

## 2024-01-16 VITALS — BP 116/64 | HR 88 | Temp 97.0°F | Resp 12 | Ht 64.0 in | Wt 146.2 lb

## 2024-01-16 DIAGNOSIS — H1013 Acute atopic conjunctivitis, bilateral: Secondary | ICD-10-CM

## 2024-01-16 DIAGNOSIS — J302 Other seasonal allergic rhinitis: Secondary | ICD-10-CM

## 2024-01-16 DIAGNOSIS — J3089 Other allergic rhinitis: Secondary | ICD-10-CM | POA: Diagnosis not present

## 2024-01-16 DIAGNOSIS — J4520 Mild intermittent asthma, uncomplicated: Secondary | ICD-10-CM

## 2024-01-16 MED ORDER — ALBUTEROL SULFATE (2.5 MG/3ML) 0.083% IN NEBU: 2.5000 mg | INHALATION_SOLUTION | Freq: Four times a day (QID) | RESPIRATORY_TRACT | 1 refills | Status: AC | PRN

## 2024-01-16 MED ORDER — RYALTRIS 665-25 MCG/ACT NA SUSP: NASAL | 5 refills | Status: AC

## 2024-01-16 MED ORDER — AIRSUPRA 90-80 MCG/ACT IN AERO: 2.0000 | INHALATION_SPRAY | RESPIRATORY_TRACT | 1 refills | Status: DC | PRN

## 2024-01-16 NOTE — Progress Notes (Signed)
 [address] [place] [address] Dept: [phone]  FOLLOW UP NOTE  Patient ID: Cathy Bruce, female    DOB: 05/15/1961  Age: 63 y.o. MRN: [id number] Date of Office Visit: 01/16/2024  Assessment  Chief Complaint: Follow-up (Asthma/Allergies /No concerns) and Medication Refill  HPI Cathy Bruce is a 63 year old female who presents today for follow-up of mild intermittent reactive airway disease and seasonal and perennial allergic rhinitis.  She was last seen on March 14, 2023 by Dr. Tempie Bruce.  She denies any new diagnosis or surgery since her last office visit.  She reports that she just recently moved to the [place], [place]  area.  Mild intermittent reactive airway disease: She reports her regimen has really helped her. She denies cough, wheeze, tightness in chest, shortness of breath, and nocturnal awakenings due to breathing problems.  She does have sleep apnea and uses her machine.  Since her last office visit she has not required any systemic steroids or made any trips to the emergency room or urgent care due to breathing problems.  She has used her Airsupra inhaler approximately 3 times in the past 6 months and her albuterol  via her nebulizer 3 times.  She used these medications only when she was sick.  Seasonal and perennial allergic rhinitis: She reports that she has been doing well with her allergy  symptoms.  She was previously on allergy  injections for 10 years.  She denies rhinorrhea, nasal congestion, postnasal drip.  She has not been treated for any sinus infections since we last saw her.  She is currently not taking any antihistamines.  They are not necessary at this time.  She does use Ryaltris  as needed.  She denies itchy watery eyes.   Drug Allergies:  No Known Allergies  Review of Systems: Negative except as per HPI   Physical Exam: BP 116/64   Pulse 88   Temp (!) 97 F (36.1 C)   Resp 12   Ht 5' 4 (1.626 m)   Wt 146 lb 3.2 oz (66.3 kg)    BMI 25.10 kg/m    Physical Exam Constitutional:      Appearance: Normal appearance.  HENT:     Head: Normocephalic and atraumatic.     Comments: Pharynx normal, eyes normal, ears normal, nose normal    Right Ear: Tympanic membrane, ear canal and external ear normal.     Left Ear: Tympanic membrane, ear canal and external ear normal.     Nose: Nose normal.     Mouth/Throat:     Mouth: Mucous membranes are moist.     Pharynx: Oropharynx is clear.   Eyes:     Conjunctiva/sclera: Conjunctivae normal.    Cardiovascular:     Rate and Rhythm: Regular rhythm.     Heart sounds: Normal heart sounds.  Pulmonary:     Effort: Pulmonary effort is normal.     Breath sounds: Normal breath sounds.     Comments: Lungs clear to auscultation  Musculoskeletal:     Cervical back: Neck supple.   Skin:    General: Skin is warm.   Neurological:     Mental Status: She is alert and oriented to person, place, and time.   Psychiatric:        Mood and Affect: Mood normal.        Behavior: Behavior normal.        Thought Content: Thought content normal.        Judgment: Judgment normal.  Diagnostics: FVC 2.59 L (84%), FEV1 2.14 L (89%), FEV1/FVC 0.83.  Spirometry indicates normal spirometry.  Assessment and Plan: 1. Seasonal and perennial allergic rhinitis   2. Mild intermittent reactive airway disease without complication   3. Allergic conjunctivitis of both eyes     Meds ordered this encounter  Medications   albuterol  (PROVENTIL ) (2.5 MG/3ML) 0.083% nebulizer solution    Sig: Take 3 mLs (2.5 mg total) by nebulization every 6 (six) hours as needed for wheezing or shortness of breath.    Dispense:  90 mL    Refill:  1   Olopatadine-Mometasone (RYALTRIS ) 665-25 MCG/ACT SUSP    Sig: Place 2 sprays in each nostril once a day to twice a day as needed for runny nose/stuffy nose    Dispense:  29 g    Refill:  5   Albuterol -Budesonide (AIRSUPRA) 90-80 MCG/ACT AERO    Sig: Inhale 2 puffs  into the lungs as needed (Do not exceed 12 puffs in 24 hours).    Dispense:  10.7 g    Refill:  1    Patient Instructions  Mild intermittent reactive airway disease-Controlled - Daily controller medication(s): none at this time - Prior to physical activity if needed: albuterol  2 puffs or Airsupra 10-15 minutes before physical activity. - Rescue medications: Airsupra 2 puffs OR albuterol  2 puffs OR albuterol  1 vial via nebulizer every 4-6 hours as needed Cathy Bruce is a rescue inhaler with albuterol  + budesonide in pump; this inhaler is superior to albuterol  alone and usually provides longer lasting benefit vs albuterol  alone.  - Breathing control goals:  * Full participation in all desired activities (may need albuterol  before activity) * Albuterol  use two time or less a week on average (not counting use with activity) * Cough interfering with sleep two time or less a month * Oral steroids no more than once a year * No hospitalizations  Seasonal and perennial allergic rhinitis-well controlled - Environmental allergy  testing on 03/14/23 showed: grasses, weeds, trees, indoor molds, outdoor molds, and dust mites - Continue avoidance measures . - May use Xyzal (levocetirizine) 5mg  tablet once daily.   -Continue Ryaltris  (olopatadine/mometasone) two sprays per nostril 1-2 times daily as needed for runny or stuffy nose.  -May use Pataday 1 drop each eye daily as needed for itchy/watery eyes.  - You can use an extra dose of the antihistamine, if needed, for breakthrough symptoms.  - Consider nasal saline rinses 1-2 times daily to remove allergens from the nasal cavities as well as help with mucous clearance (this is especially helpful to do before the nasal sprays are given) - Consider another course of allergy  shots as a means of long-term control if medication management is not effective.  - Allergy  shots re-train and reset the immune system to ignore environmental allergens and decrease the  resulting immune response to those allergens (sneezing, itchy watery eyes, runny nose, nasal congestion, etc).    - Allergy  shots improve symptoms in 75-85% of patients.   Follow-up as needed ( recently moved to [place], [place] area)  Return if symptoms worsen or fail to improve.    Thank you for the opportunity to care for this patient.  Please do not hesitate to contact me with questions.  Tinnie Forehand, FNP [facility] 

## 2024-01-20 ENCOUNTER — Other Ambulatory Visit: Payer: Self-pay | Admitting: Cardiology

## 2024-01-27 NOTE — Telephone Encounter (Signed)
 Please send in an albuterol  inhaler 2 puffs every 4-6 hours as needed for cough, wheeze, tightness in chest, or shortness of breath with 1 refill

## 2024-02-18 ENCOUNTER — Other Ambulatory Visit: Payer: Self-pay | Admitting: Cardiology

## 2024-02-25 DIAGNOSIS — Z85828 Personal history of other malignant neoplasm of skin: Secondary | ICD-10-CM | POA: Diagnosis not present

## 2024-02-25 DIAGNOSIS — D225 Melanocytic nevi of trunk: Secondary | ICD-10-CM | POA: Diagnosis not present

## 2024-02-25 DIAGNOSIS — D2272 Melanocytic nevi of left lower limb, including hip: Secondary | ICD-10-CM | POA: Diagnosis not present

## 2024-02-25 DIAGNOSIS — L905 Scar conditions and fibrosis of skin: Secondary | ICD-10-CM | POA: Diagnosis not present

## 2024-02-25 DIAGNOSIS — L570 Actinic keratosis: Secondary | ICD-10-CM | POA: Diagnosis not present

## 2024-02-25 DIAGNOSIS — D2271 Melanocytic nevi of right lower limb, including hip: Secondary | ICD-10-CM | POA: Diagnosis not present

## 2024-02-25 DIAGNOSIS — D2262 Melanocytic nevi of left upper limb, including shoulder: Secondary | ICD-10-CM | POA: Diagnosis not present

## 2024-02-25 DIAGNOSIS — L821 Other seborrheic keratosis: Secondary | ICD-10-CM | POA: Diagnosis not present

## 2024-02-25 DIAGNOSIS — D2261 Melanocytic nevi of right upper limb, including shoulder: Secondary | ICD-10-CM | POA: Diagnosis not present

## 2024-02-27 ENCOUNTER — Other Ambulatory Visit: Payer: Self-pay | Admitting: Cardiology

## 2024-03-10 ENCOUNTER — Other Ambulatory Visit: Payer: Self-pay | Admitting: Cardiology

## 2024-03-26 ENCOUNTER — Encounter: Payer: Self-pay | Admitting: Neurology

## 2024-03-26 NOTE — Progress Notes (Unsigned)
 Cathy Bruce

## 2024-03-27 ENCOUNTER — Telehealth (INDEPENDENT_AMBULATORY_CARE_PROVIDER_SITE_OTHER): Payer: Self-pay | Admitting: Adult Health

## 2024-03-27 ENCOUNTER — Encounter: Payer: Self-pay | Admitting: Adult Health

## 2024-03-27 DIAGNOSIS — G4733 Obstructive sleep apnea (adult) (pediatric): Secondary | ICD-10-CM

## 2025-03-26 ENCOUNTER — Telehealth: Payer: Self-pay | Admitting: Adult Health
